# Patient Record
Sex: Female | Born: 1960 | Race: White | Hispanic: No | Marital: Married | Smoking: Never smoker
Health system: Southern US, Community
[De-identification: ages and names within clinical notes are randomized; demographics above are authoritative.]

## PROBLEM LIST (undated history)

## (undated) DIAGNOSIS — N200 Calculus of kidney: Secondary | ICD-10-CM

## (undated) HISTORY — DX: Calculus of kidney: N20.0

---

## 2013-12-29 ENCOUNTER — Ambulatory Visit: Payer: BLUE CROSS/BLUE SHIELD

## 2013-12-29 ENCOUNTER — Encounter (HOSPITAL_COMMUNITY): Payer: BC Managed Care – PPO | Admitting: Anesthesiology

## 2013-12-29 ENCOUNTER — Ambulatory Visit: Admit: 2013-12-29 | Payer: Self-pay | Admitting: General Surgery

## 2013-12-29 ENCOUNTER — Ambulatory Visit (HOSPITAL_COMMUNITY)
Admission: RE | Admit: 2013-12-29 | Discharge: 2013-12-29 | Disposition: A | Discharge status: Home / Self Care | Payer: BC Managed Care – PPO | Source: Ambulatory Visit | Attending: Family Medicine | Admitting: Family Medicine

## 2013-12-29 ENCOUNTER — Emergency Department (HOSPITAL_COMMUNITY): Payer: BC Managed Care – PPO | Admitting: Anesthesiology

## 2013-12-29 ENCOUNTER — Encounter (HOSPITAL_COMMUNITY)
Admission: EM | Disposition: A | Discharge status: Home / Self Care | Payer: Self-pay | Source: Home / Self Care | Attending: Emergency Medicine

## 2013-12-29 ENCOUNTER — Observation Stay (HOSPITAL_COMMUNITY)
Admission: EM | Admit: 2013-12-29 | Discharge: 2013-12-30 | Disposition: A | Discharge status: Home / Self Care | Payer: BC Managed Care – PPO | Attending: Surgery | Admitting: Surgery

## 2013-12-29 ENCOUNTER — Encounter (HOSPITAL_COMMUNITY): Payer: Self-pay | Admitting: Emergency Medicine

## 2013-12-29 ENCOUNTER — Ambulatory Visit (INDEPENDENT_AMBULATORY_CARE_PROVIDER_SITE_OTHER): Payer: BLUE CROSS/BLUE SHIELD | Admitting: Family Medicine

## 2013-12-29 VITALS — BP 160/90 | HR 98 | Temp 97.9°F | Resp 18 | Ht 65.0 in | Wt 191.2 lb

## 2013-12-29 DIAGNOSIS — R110 Nausea: Secondary | ICD-10-CM

## 2013-12-29 DIAGNOSIS — K3580 Unspecified acute appendicitis: Secondary | ICD-10-CM

## 2013-12-29 DIAGNOSIS — K37 Unspecified appendicitis: Secondary | ICD-10-CM

## 2013-12-29 DIAGNOSIS — R1033 Periumbilical pain: Secondary | ICD-10-CM | POA: Insufficient documentation

## 2013-12-29 DIAGNOSIS — R1031 Right lower quadrant pain: Secondary | ICD-10-CM | POA: Insufficient documentation

## 2013-12-29 DIAGNOSIS — R1011 Right upper quadrant pain: Secondary | ICD-10-CM

## 2013-12-29 DIAGNOSIS — R109 Unspecified abdominal pain: Secondary | ICD-10-CM

## 2013-12-29 HISTORY — PX: LAPAROSCOPIC APPENDECTOMY: SHX408

## 2013-12-29 LAB — POCT CBC
GRANULOCYTE PERCENT: 81.4 % — AB (ref 37–80)
HCT, POC: 44.3 % (ref 37.7–47.9)
Hemoglobin: 14.2 g/dL (ref 12.2–16.2)
Lymph, poc: 1.3 (ref 0.6–3.4)
MCH, POC: 30.8 pg (ref 27–31.2)
MCHC: 32.1 g/dL (ref 31.8–35.4)
MCV: 96.1 fL (ref 80–97)
MID (CBC): 0.8 (ref 0–0.9)
MPV: 11.7 fL (ref 0–99.8)
PLATELET COUNT, POC: 189 10*3/uL (ref 142–424)
POC GRANULOCYTE: 9 — AB (ref 2–6.9)
POC LYMPH PERCENT: 11.7 %L (ref 10–50)
POC MID %: 6.9 % (ref 0–12)
RBC: 4.61 M/uL (ref 4.04–5.48)
RDW, POC: 12.5 %
WBC: 11.1 10*3/uL — AB (ref 4.6–10.2)

## 2013-12-29 LAB — POCT URINALYSIS DIPSTICK
Bilirubin, UA: NEGATIVE
GLUCOSE UA: NEGATIVE
Ketones, UA: NEGATIVE
Leukocytes, UA: NEGATIVE
Nitrite, UA: NEGATIVE
RBC UA: NEGATIVE
Spec Grav, UA: 1.02
UROBILINOGEN UA: 0.2
pH, UA: 6

## 2013-12-29 LAB — BASIC METABOLIC PANEL
BUN: 13 mg/dL (ref 6–23)
CALCIUM: 9.7 mg/dL (ref 8.4–10.5)
CHLORIDE: 96 meq/L (ref 96–112)
CO2: 28 mEq/L (ref 19–32)
Creatinine, Ser: 0.72 mg/dL (ref 0.50–1.10)
GFR calc non Af Amer: >90 mL/min (ref >90)
Glucose, Bld: 116 mg/dL — ABNORMAL HIGH (ref 70–99)
Potassium: 3.7 mEq/L (ref 3.7–5.3)
Sodium: 136 mEq/L — ABNORMAL LOW (ref 137–147)

## 2013-12-29 LAB — POCT UA - MICROSCOPIC ONLY
CRYSTALS, UR, HPF, POC: NEGATIVE
Casts, Ur, LPF, POC: NEGATIVE
Mucus, UA: POSITIVE
RBC, urine, microscopic: NEGATIVE
Yeast, UA: NEGATIVE

## 2013-12-29 SURGERY — APPENDECTOMY, LAPAROSCOPIC
Anesthesia: General | Site: Abdomen

## 2013-12-29 MED ORDER — HEPARIN SODIUM (PORCINE) 5000 UNIT/ML IJ SOLN
5000.0000 [IU] | Freq: Three times a day (TID) | INTRAMUSCULAR | Status: DC
  Filled 2013-12-29 (×3): qty 1

## 2013-12-29 MED ORDER — ONDANSETRON HCL 4 MG/2ML IJ SOLN: 4.0000 mg | INTRAMUSCULAR | Status: DC | PRN

## 2013-12-29 MED ORDER — ONDANSETRON HCL 4 MG/2ML IJ SOLN
INTRAMUSCULAR | Status: AC
  Filled 2013-12-29: qty 2

## 2013-12-29 MED ORDER — ROCURONIUM BROMIDE 100 MG/10ML IV SOLN
INTRAVENOUS | Status: AC
  Filled 2013-12-29: qty 1

## 2013-12-29 MED ORDER — MIDAZOLAM HCL 2 MG/2ML IJ SOLN
INTRAMUSCULAR | Status: AC
  Filled 2013-12-29: qty 2

## 2013-12-29 MED ORDER — IOHEXOL 300 MG/ML  SOLN
50.0000 mL | Freq: Once | INTRAMUSCULAR | Status: AC | PRN
  Administered 2013-12-29: 50 mL via ORAL

## 2013-12-29 MED ORDER — MIDAZOLAM HCL 5 MG/5ML IJ SOLN
INTRAMUSCULAR | Status: DC | PRN
  Administered 2013-12-29: 2 mg via INTRAVENOUS

## 2013-12-29 MED ORDER — BUPIVACAINE HCL (PF) 0.5 % IJ SOLN
INTRAMUSCULAR | Status: DC | PRN
  Administered 2013-12-29: 8 mL

## 2013-12-29 MED ORDER — ROCURONIUM BROMIDE 100 MG/10ML IV SOLN
INTRAVENOUS | Status: DC | PRN
  Administered 2013-12-29: 10 mg via INTRAVENOUS
  Administered 2013-12-29: 30 mg via INTRAVENOUS

## 2013-12-29 MED ORDER — ONDANSETRON HCL 4 MG PO TABS: 4.0000 mg | ORAL_TABLET | Freq: Four times a day (QID) | ORAL | Status: DC | PRN

## 2013-12-29 MED ORDER — DEXAMETHASONE SODIUM PHOSPHATE 10 MG/ML IJ SOLN
INTRAMUSCULAR | Status: AC
  Filled 2013-12-29: qty 1

## 2013-12-29 MED ORDER — SUCCINYLCHOLINE CHLORIDE 20 MG/ML IJ SOLN
INTRAMUSCULAR | Status: DC | PRN
Start: 2013-12-29 — End: 2013-12-29
  Administered 2013-12-29: 100 mg via INTRAVENOUS

## 2013-12-29 MED ORDER — PIPERACILLIN-TAZOBACTAM 3.375 G IVPB
3.3750 g | Freq: Once | INTRAVENOUS | Status: AC
  Administered 2013-12-29: 3.375 g via INTRAVENOUS
  Filled 2013-12-29 (×2): qty 50

## 2013-12-29 MED ORDER — NEOSTIGMINE METHYLSULFATE 10 MG/10ML IV SOLN
INTRAVENOUS | Status: AC
  Filled 2013-12-29: qty 1

## 2013-12-29 MED ORDER — IOHEXOL 300 MG/ML  SOLN
100.0000 mL | Freq: Once | INTRAMUSCULAR | Status: AC | PRN
  Administered 2013-12-29: 100 mL via INTRAVENOUS

## 2013-12-29 MED ORDER — GLYCOPYRROLATE 0.2 MG/ML IJ SOLN
INTRAMUSCULAR | Status: AC
  Filled 2013-12-29: qty 3

## 2013-12-29 MED ORDER — KCL-LACTATED RINGERS-D5W 20 MEQ/L IV SOLN
INTRAVENOUS | Status: DC
  Administered 2013-12-29: via INTRAVENOUS
  Filled 2013-12-29 (×2): qty 1000

## 2013-12-29 MED ORDER — GLYCOPYRROLATE 0.2 MG/ML IJ SOLN
INTRAMUSCULAR | Status: DC | PRN
  Administered 2013-12-29: 0.6 mg via INTRAVENOUS

## 2013-12-29 MED ORDER — PROPOFOL 10 MG/ML IV BOLUS
INTRAVENOUS | Status: DC | PRN
  Administered 2013-12-29: 200 mg via INTRAVENOUS

## 2013-12-29 MED ORDER — PROPOFOL 10 MG/ML IV BOLUS
INTRAVENOUS | Status: AC
  Filled 2013-12-29: qty 20

## 2013-12-29 MED ORDER — LACTATED RINGERS IV SOLN
INTRAVENOUS | Status: DC | PRN
  Administered 2013-12-29: 20:00:00 via INTRAVENOUS

## 2013-12-29 MED ORDER — HYDROMORPHONE HCL PF 1 MG/ML IJ SOLN
1.0000 mg | Freq: Once | INTRAMUSCULAR | Status: AC
  Administered 2013-12-29: 1 mg via INTRAVENOUS
  Filled 2013-12-29: qty 1

## 2013-12-29 MED ORDER — OXYCODONE-ACETAMINOPHEN 5-325 MG PO TABS
1.0000 | ORAL_TABLET | ORAL | Status: DC | PRN
  Administered 2013-12-30: 2 via ORAL
  Filled 2013-12-29: qty 2

## 2013-12-29 MED ORDER — FENTANYL CITRATE 0.05 MG/ML IJ SOLN
INTRAMUSCULAR | Status: AC
  Filled 2013-12-29: qty 5

## 2013-12-29 MED ORDER — ONDANSETRON HCL 4 MG/2ML IJ SOLN
INTRAMUSCULAR | Status: DC | PRN
  Administered 2013-12-29: 4 mg via INTRAVENOUS

## 2013-12-29 MED ORDER — ONDANSETRON HCL 4 MG/2ML IJ SOLN
4.0000 mg | Freq: Once | INTRAMUSCULAR | Status: AC
  Administered 2013-12-29: 4 mg via INTRAVENOUS
  Filled 2013-12-29: qty 2

## 2013-12-29 MED ORDER — PIPERACILLIN-TAZOBACTAM 3.375 G IVPB
3.3750 g | Freq: Three times a day (TID) | INTRAVENOUS | Status: DC
  Administered 2013-12-29 – 2013-12-30 (×2): 3.375 g via INTRAVENOUS
  Filled 2013-12-29 (×3): qty 50

## 2013-12-29 MED ORDER — LACTATED RINGERS IV SOLN
INTRAVENOUS | Status: DC | PRN
  Administered 2013-12-29: 2000 mL via INTRAVENOUS

## 2013-12-29 MED ORDER — SODIUM CHLORIDE 0.9 % IV SOLN
3.0000 g | Freq: Once | INTRAVENOUS | Status: DC
  Filled 2013-12-29: qty 3

## 2013-12-29 MED ORDER — BUPIVACAINE HCL (PF) 0.5 % IJ SOLN
INTRAMUSCULAR | Status: AC
  Filled 2013-12-29: qty 30

## 2013-12-29 MED ORDER — DEXAMETHASONE SODIUM PHOSPHATE 10 MG/ML IJ SOLN
INTRAMUSCULAR | Status: DC | PRN
  Administered 2013-12-29: 10 mg via INTRAVENOUS

## 2013-12-29 MED ORDER — MORPHINE SULFATE 2 MG/ML IJ SOLN
2.0000 mg | INTRAMUSCULAR | Status: DC | PRN
  Administered 2013-12-29 – 2013-12-30 (×2): 4 mg via INTRAVENOUS
  Filled 2013-12-29 (×2): qty 2

## 2013-12-29 MED ORDER — FENTANYL CITRATE 0.05 MG/ML IJ SOLN
INTRAMUSCULAR | Status: DC | PRN
  Administered 2013-12-29: 50 ug via INTRAVENOUS
  Administered 2013-12-29: 100 ug via INTRAVENOUS
  Administered 2013-12-29 (×2): 50 ug via INTRAVENOUS

## 2013-12-29 MED ORDER — SODIUM CHLORIDE 0.9 % IV BOLUS (SEPSIS)
1000.0000 mL | Freq: Once | INTRAVENOUS | Status: AC
  Administered 2013-12-29: 1000 mL via INTRAVENOUS

## 2013-12-29 MED ORDER — LIDOCAINE HCL (PF) 2 % IJ SOLN
INTRAMUSCULAR | Status: DC | PRN
  Administered 2013-12-29: 75 mg via INTRADERMAL

## 2013-12-29 MED ORDER — NEOSTIGMINE METHYLSULFATE 10 MG/10ML IV SOLN
INTRAVENOUS | Status: DC | PRN
  Administered 2013-12-29: 5 mg via INTRAVENOUS

## 2013-12-29 MED ORDER — LIDOCAINE HCL (CARDIAC) 20 MG/ML IV SOLN
INTRAVENOUS | Status: AC
  Filled 2013-12-29: qty 5

## 2013-12-29 SURGICAL SUPPLY — 47 items
APL SKNCLS STERI-STRIP NONHPOA (GAUZE/BANDAGES/DRESSINGS) ×1
APPLIER CLIP 5 13 M/L LIGAMAX5 (MISCELLANEOUS)
APPLIER CLIP ROT 10 11.4 M/L (STAPLE)
APR CLP MED LRG 11.4X10 (STAPLE)
APR CLP MED LRG 5 ANG JAW (MISCELLANEOUS)
BAG SPEC RTRVL LRG 6X4 10 (ENDOMECHANICALS) ×1
BENZOIN TINCTURE PRP APPL 2/3 (GAUZE/BANDAGES/DRESSINGS) ×2 IMPLANT
CANISTER SUCTION 2500CC (MISCELLANEOUS) ×2 IMPLANT
CHLORAPREP W/TINT 26ML (MISCELLANEOUS) ×2 IMPLANT
CLIP APPLIE 5 13 M/L LIGAMAX5 (MISCELLANEOUS) IMPLANT
CLIP APPLIE ROT 10 11.4 M/L (STAPLE) IMPLANT
CUTTER FLEX LINEAR 45M (STAPLE) ×1 IMPLANT
DECANTER SPIKE VIAL GLASS SM (MISCELLANEOUS) ×2 IMPLANT
DRAIN CHANNEL 19F RND (DRAIN) IMPLANT
DRAPE LAPAROSCOPIC ABDOMINAL (DRAPES) ×2 IMPLANT
DRSG TEGADERM 2-3/8X2-3/4 SM (GAUZE/BANDAGES/DRESSINGS) ×3 IMPLANT
ELECT REM PT RETURN 9FT ADLT (ELECTROSURGICAL) ×2
ELECTRODE REM PT RTRN 9FT ADLT (ELECTROSURGICAL) ×1 IMPLANT
ENDOLOOP SUT PDS II  0 18 (SUTURE)
ENDOLOOP SUT PDS II 0 18 (SUTURE) IMPLANT
EVACUATOR SILICONE 100CC (DRAIN) IMPLANT
GAUZE SPONGE 2X2 8PLY STRL LF (GAUZE/BANDAGES/DRESSINGS) IMPLANT
GLOVE ECLIPSE 8.0 STRL XLNG CF (GLOVE) ×2 IMPLANT
GLOVE INDICATOR 8.0 STRL GRN (GLOVE) ×2 IMPLANT
GOWN STRL REUS W/TWL XL LVL3 (GOWN DISPOSABLE) ×4 IMPLANT
KIT BASIN OR (CUSTOM PROCEDURE TRAY) ×2 IMPLANT
POUCH SPECIMEN RETRIEVAL 10MM (ENDOMECHANICALS) ×2 IMPLANT
RELOAD 45 VASCULAR/THIN (ENDOMECHANICALS) IMPLANT
RELOAD STAPLE 45 2.5 WHT GRN (ENDOMECHANICALS) IMPLANT
RELOAD STAPLE 45 3.5 BLU ETS (ENDOMECHANICALS) IMPLANT
RELOAD STAPLE TA45 3.5 REG BLU (ENDOMECHANICALS) ×2 IMPLANT
SET IRRIG TUBING LAPAROSCOPIC (IRRIGATION / IRRIGATOR) ×2 IMPLANT
SHEARS HARMONIC ACE PLUS 36CM (ENDOMECHANICALS) ×2 IMPLANT
SLEEVE XCEL OPT CAN 5 100 (ENDOMECHANICALS) ×2 IMPLANT
SOLUTION ANTI FOG 6CC (MISCELLANEOUS) ×2 IMPLANT
SPONGE GAUZE 2X2 STER 10/PKG (GAUZE/BANDAGES/DRESSINGS) ×1
STRIP CLOSURE SKIN 1/2X4 (GAUZE/BANDAGES/DRESSINGS) ×2 IMPLANT
SUT ETHILON 3 0 PS 1 (SUTURE) IMPLANT
SUT MNCRL AB 4-0 PS2 18 (SUTURE) ×2 IMPLANT
TAPE STRIPS DRAPE STRL (GAUZE/BANDAGES/DRESSINGS) ×1 IMPLANT
TOWEL OR 17X26 10 PK STRL BLUE (TOWEL DISPOSABLE) ×2 IMPLANT
TOWEL OR NON WOVEN STRL DISP B (DISPOSABLE) ×2 IMPLANT
TRAY FOLEY CATH 14FRSI W/METER (CATHETERS) ×2 IMPLANT
TRAY LAP CHOLE (CUSTOM PROCEDURE TRAY) ×2 IMPLANT
TROCAR BLADELESS OPT 5 100 (ENDOMECHANICALS) ×2 IMPLANT
TROCAR XCEL BLUNT TIP 100MML (ENDOMECHANICALS) ×2 IMPLANT
TUBING INSUFFLATION 10FT LAP (TUBING) ×2 IMPLANT

## 2013-12-29 NOTE — Transfer of Care (Signed)
Immediate Anesthesia Transfer of Care Note  Patient: Dawn Humphrey  Procedure(s) Performed: Procedure(s): APPENDECTOMY LAPAROSCOPIC (N/A)  Patient Location: PACU  Anesthesia Type:General  Level of Consciousness: awake, alert , oriented and patient cooperative  Airway & Oxygen Therapy: Patient Spontanous Breathing and Patient connected to face mask oxygen  Post-op Assessment: Report given to PACU RN, Post -op Vital signs reviewed and stable and Patient moving all extremities X 4  Post vital signs: Reviewed and stable  Complications: No apparent anesthesia complications

## 2013-12-29 NOTE — H&P (Signed)
Dawn Humphrey is an 53 y.o. female.   Chief Complaint: Abdominal pain  HPI: She had the onset of centralized abdominal pain yesterday at 3 PM. It then radiated down to the right lower quadrant. She had some fever and chills as well as nausea. She presented to Dr. Clayborn Heron office and was evaluated. She was sent for a CT scan which was consistent with acute appendicitis. She subsequently was told to come to [facility] emergency department and I was asked to see her.  History reviewed. No pertinent past medical history.  Past Surgical History  Procedure Laterality Date  . Cesarean section      Family History  Problem Relation Age of Onset  . Cancer Father    Social History:  reports that she has never smoked. She has never used smokeless tobacco. She reports that she drinks about 1.2 ounces of alcohol per week. Her drug history is not on file.  Allergies: No Known Allergies  Prior to Admission medications   Not on File      (Not in a hospital admission)  Results for orders placed in visit on 12/29/13 (from the past 48 hour(s))  POCT CBC     Status: Abnormal   Collection Time    12/29/13 11:31 AM      Result Value Ref Range   WBC 11.1 (*) 4.6 - 10.2 K/uL   Lymph, poc 1.3  0.6 - 3.4   POC LYMPH PERCENT 11.7  10 - 50 %L   MID (cbc) 0.8  0 - 0.9   POC MID % 6.9  0 - 12 %M   POC Granulocyte 9.0 (*) 2 - 6.9   Granulocyte percent 81.4 (*) 37 - 80 %G   RBC 4.61  4.04 - 5.48 M/uL   Hemoglobin 14.2  12.2 - 16.2 g/dL   HCT, POC 44.3  37.7 - 47.9 %   MCV 96.1  80 - 97 fL   MCH, POC 30.8  27 - 31.2 pg   MCHC 32.1  31.8 - 35.4 g/dL   RDW, POC 12.5     Platelet Count, POC 189  142 - 424 K/uL   MPV 11.7  0 - 99.8 fL  POCT URINALYSIS DIPSTICK     Status: Normal   Collection Time    12/29/13 11:31 AM      Result Value Ref Range   Color, UA yellow     Clarity, UA clear     Glucose, UA neg     Bilirubin, UA neg     Ketones, UA neg     Spec Grav, UA 1.020     Blood, UA neg     pH, UA 6.0      Protein, UA trace     Urobilinogen, UA 0.2     Nitrite, UA neg     Leukocytes, UA Negative    POCT UA - MICROSCOPIC ONLY     Status: Abnormal   Collection Time    12/29/13 11:31 AM      Result Value Ref Range   WBC, Ur, HPF, POC 1-5     RBC, urine, microscopic neg     Bacteria, U Microscopic 1+     Mucus, UA positive     Epithelial cells, urine per micros 6-8     Crystals, Ur, HPF, POC neg     Casts, Ur, LPF, POC neg     Yeast, UA neg     Ct Abdomen Pelvis W Contrast  12/29/2013  CLINICAL DATA:  Right lower quadrant pain  EXAM: CT ABDOMEN AND PELVIS WITH CONTRAST  TECHNIQUE: Multidetector CT imaging of the abdomen and pelvis was performed using the standard protocol following bolus administration of intravenous contrast.  CONTRAST:  70mL OMNIPAQUE IOHEXOL 300 MG/ML SOLN, 135mL OMNIPAQUE IOHEXOL 300 MG/ML SOLN  COMPARISON:  None.  FINDINGS: The lung bases are clear.  The liver demonstrates no focal abnormality. There is no intrahepatic or extrahepatic biliary ductal dilatation. The gallbladder is normal. The spleen demonstrates no focal abnormality. The kidneys, adrenal glands and pancreas are normal. The bladder is unremarkable.  The stomach, duodenum, small intestine, and large intestine demonstrate no contrast extravasation or dilatation. The appendix is dilated measuring 13 mm in diameter with periappendiceal inflammatory changes most consistent with acute appendicitis. There is bowel wall thickening of the terminal ileum which is likely reactive secondary to adjacent periappendiceal inflammatory changes. There is no pneumoperitoneum, pneumatosis, or portal venous gas. There is a small amount of pelvic free fluid. No focal fluid collection to suggest an abscess. There is no lymphadenopathy.  The abdominal aorta is normal in caliber.  There are no lytic or sclerotic osseous lesions.  IMPRESSION: 1. Findings most consistent with acute appendicitis. No focal fluid collection to suggest an  abscess. These results were called by telephone at the time of interpretation on 12/29/2013 at 3:45 PM to Dr. Ruben Reason , who verbally acknowledged these results.   Electronically Signed   By: Kathreen Devoid   On: 12/29/2013 16:02   Dg Abd Acute W/chest  12/29/2013   CLINICAL DATA:  Right upper quadrant pain  EXAM: ACUTE ABDOMEN SERIES (ABDOMEN 2 VIEW & CHEST 1 VIEW)  COMPARISON:  None.  FINDINGS: There is no evidence of dilated bowel loops or free intraperitoneal air. No radiopaque calculi or other significant radiographic abnormality is seen. Heart size and mediastinal contours are within normal limits. Both lungs are clear.  IMPRESSION: Negative abdominal radiographs.  No acute cardiopulmonary disease.   Electronically Signed   By: Kathreen Devoid   On: 12/29/2013 12:49    Review of Systems  Constitutional: Positive for fever and chills.  HENT: Negative.   Cardiovascular: Negative.   Gastrointestinal: Positive for nausea and abdominal pain. Negative for diarrhea and blood in stool.  Genitourinary: Positive for dysuria. Negative for hematuria.  Endo/Heme/Allergies: Does not bruise/bleed easily.    Blood pressure 125/93, pulse 108, temperature 99.7 F (37.6 C), temperature source Oral, resp. rate 19, SpO2 98.00%. Physical Exam  Constitutional: She appears well-developed and well-nourished. No distress.  HENT:  Head: Normocephalic and atraumatic.  Eyes: EOM are normal. No scleral icterus.  Neck: Neck supple.  Cardiovascular: Normal rate and regular rhythm.   Respiratory: Effort normal and breath sounds normal.  GI: Soft. She exhibits no mass. There is tenderness (RLQ). There is guarding (RLQ).  Lower transverse scar  Musculoskeletal: She exhibits no edema.  Lymphadenopathy:    She has no cervical adenopathy.  Neurological: She is alert.  Skin: Skin is warm and dry.  Psychiatric: She has a normal mood and affect. Her behavior is normal.     Assessment/Plan Acute appendicitis.  Plan:  IV antibiotics. Laparoscopic possible open appendectomy. I have discussed the procedure and risks of appendectomy. The risks include but are not limited to bleeding, infection, wound problems, anesthesia, injury to intra-abdominal organs, possibility of postoperative ileus. She seems to understand and agrees with the plan.  Rhunette Croft Livan Hires 12/29/2013, 6:33 PM

## 2013-12-29 NOTE — Patient Instructions (Signed)
Go to [facility] for outpatient CT abd/pelvis. Register at radiology 1st floor.

## 2013-12-29 NOTE — Progress Notes (Signed)
Subjective: 53 year old lady who is here abdominal pain started about 3 PM yesterday. It is persisted in hurting. The pain started a little bit gently, then moved mostly to the right lower quadrant. It has persisted. She has been nauseous but no vomiting. She had a small bowel movement this morning. She has not had pain like this in the past. She has not been febrile though she has hot flashes.  Objective: Pleasant alert lady. Chest clear. Heart regular without murmurs. Skin normal to touch. Does not feel febrile. Abdomen has bowel sounds present. Soft without organomegaly or masses. Diffuse tenderness but most marked tenderness in the right lower quadrant. Some rebound to right lower quadrant.  Assessment: Right lower quadrant abdominal pain Rule out appendicitis  Plan: Abdominal x-ray, CBC, UA  Results for orders placed in visit on 12/29/13  POCT CBC      Result Value Ref Range   WBC 11.1 (*) 4.6 - 10.2 K/uL   Lymph, poc 1.3  0.6 - 3.4   POC LYMPH PERCENT 11.7  10 - 50 %L   MID (cbc) 0.8  0 - 0.9   POC MID % 6.9  0 - 12 %M   POC Granulocyte 9.0 (*) 2 - 6.9   Granulocyte percent 81.4 (*) 37 - 80 %G   RBC 4.61  4.04 - 5.48 M/uL   Hemoglobin 14.2  12.2 - 16.2 g/dL   HCT, POC 44.3  37.7 - 47.9 %   MCV 96.1  80 - 97 fL   MCH, POC 30.8  27 - 31.2 pg   MCHC 32.1  31.8 - 35.4 g/dL   RDW, POC 12.5     Platelet Count, POC 189  142 - 424 K/uL   MPV 11.7  0 - 99.8 fL  POCT URINALYSIS DIPSTICK      Result Value Ref Range   Color, UA yellow     Clarity, UA clear     Glucose, UA neg     Bilirubin, UA neg     Ketones, UA neg     Spec Grav, UA 1.020     Blood, UA neg     pH, UA 6.0     Protein, UA trace     Urobilinogen, UA 0.2     Nitrite, UA neg     Leukocytes, UA Negative    POCT UA - MICROSCOPIC ONLY      Result Value Ref Range   WBC, Ur, HPF, POC 1-5     RBC, urine, microscopic neg     Bacteria, U Microscopic 1+     Mucus, UA positive     Epithelial cells, urine per micros  6-8     Crystals, Ur, HPF, POC neg     Casts, Ur, LPF, POC neg     Yeast, UA neg     [facility] reading (PRIMARY) by  Dr. Linna Darner Normal abdomen  Assessment: Vital quadrant abdominal pain rule out appendicitis  Plan: Stat CT scan at [facility] long. Call report.  CT shows acute appendicitis. Patient sent to the emergency room. Emergency room notified the patient would need to see a surgeon. Patient instructed on the phone what would need to take place.  50 minutes spent in patient care.  Assessment: Acute appendicitis

## 2013-12-29 NOTE — ED Provider Notes (Signed)
CSN: [id number]     Arrival date & time 12/29/13  1709 History   First MD Initiated Contact with Patient 12/29/13 1719     Chief Complaint  Patient presents with  . RLQ pain      (Consider location/radiation/quality/duration/timing/severity/associated sxs/prior Treatment) The history is provided by the patient.  pt c/o rlq abdominal pain since yesterday. Acute onset, constant, slowly worse. Pain dull, moderate-severe, non radiating, worse w palpation.  Decreased appetite. Nausea, no vomiting. No diarrhea. No hx same pain. No fever or chills. Only prior abd surgery is remote hx c section. No dysuria or hematuria. No vaginal discharge or bleeding, states is post-menopausal.      History reviewed. No pertinent past medical history. Past Surgical History  Procedure Laterality Date  . Cesarean section     Family History  Problem Relation Age of Onset  . Cancer Father    History  Substance Use Topics  . Smoking status: Never Smoker   . Smokeless tobacco: Never Used  . Alcohol Use: 1.2 oz/week    2 Glasses of wine per week   OB History   Grav Para Term Preterm Abortions TAB SAB Ect Mult Living                 Review of Systems  Constitutional: Negative for fever.  HENT: Negative for sore throat.   Eyes: Negative for redness.  Respiratory: Negative for shortness of breath.   Cardiovascular: Negative for chest pain.  Gastrointestinal: Positive for abdominal pain. Negative for vomiting and diarrhea.  Genitourinary: Negative for dysuria and flank pain.  Musculoskeletal: Negative for back pain and neck pain.  Skin: Negative for rash.  Neurological: Negative for headaches.  Hematological: Does not bruise/bleed easily.  Psychiatric/Behavioral: Negative for confusion.      Allergies  Review of patient's allergies indicates no known allergies.  Home Medications   Prior to Admission medications   Not on File   BP 125/93  Pulse 108  Temp(Src) 99.7 F (37.6 C) (Oral)   Resp 19  SpO2 98% Physical Exam  Nursing note and vitals reviewed. Constitutional: She appears well-developed and well-nourished. No distress.  HENT:  Mouth/Throat: Oropharynx is clear and moist.  Eyes: Conjunctivae are normal. No scleral icterus.  Neck: Neck supple. No tracheal deviation present.  Cardiovascular: Normal rate.   Pulmonary/Chest: Effort normal. No respiratory distress.  Abdominal: Soft. Normal appearance. She exhibits no distension and no mass. There is tenderness. There is guarding. There is no rebound.  Marked rlq tenderness.   Genitourinary:  No cva tenderness  Musculoskeletal: She exhibits no edema.  Neurological: She is alert.  Skin: Skin is warm and dry. No rash noted. She is not diaphoretic.  Psychiatric: She has a normal mood and affect.    ED Course  Procedures (including critical care time) Labs Review   Results for orders placed in visit on 12/29/13  POCT CBC      Result Value Ref Range   WBC 11.1 (*) 4.6 - 10.2 K/uL   Lymph, poc 1.3  0.6 - 3.4   POC LYMPH PERCENT 11.7  10 - 50 %L   MID (cbc) 0.8  0 - 0.9   POC MID % 6.9  0 - 12 %M   POC Granulocyte 9.0 (*) 2 - 6.9   Granulocyte percent 81.4 (*) 37 - 80 %G   RBC 4.61  4.04 - 5.48 M/uL   Hemoglobin 14.2  12.2 - 16.2 g/dL   HCT, POC 44.3  37.7 -  47.9 %   MCV 96.1  80 - 97 fL   MCH, POC 30.8  27 - 31.2 pg   MCHC 32.1  31.8 - 35.4 g/dL   RDW, POC 12.5     Platelet Count, POC 189  142 - 424 K/uL   MPV 11.7  0 - 99.8 fL  POCT URINALYSIS DIPSTICK      Result Value Ref Range   Color, UA yellow     Clarity, UA clear     Glucose, UA neg     Bilirubin, UA neg     Ketones, UA neg     Spec Grav, UA 1.020     Blood, UA neg     pH, UA 6.0     Protein, UA trace     Urobilinogen, UA 0.2     Nitrite, UA neg     Leukocytes, UA Negative    POCT UA - MICROSCOPIC ONLY      Result Value Ref Range   WBC, Ur, HPF, POC 1-5     RBC, urine, microscopic neg     Bacteria, U Microscopic 1+     Mucus, UA  positive     Epithelial cells, urine per micros 6-8     Crystals, Ur, HPF, POC neg     Casts, Ur, LPF, POC neg     Yeast, UA neg     Ct Abdomen Pelvis W Contrast  12/29/2013   CLINICAL DATA:  Right lower quadrant pain  EXAM: CT ABDOMEN AND PELVIS WITH CONTRAST  TECHNIQUE: Multidetector CT imaging of the abdomen and pelvis was performed using the standard protocol following bolus administration of intravenous contrast.  CONTRAST:  66mL OMNIPAQUE IOHEXOL 300 MG/ML SOLN, 139mL OMNIPAQUE IOHEXOL 300 MG/ML SOLN  COMPARISON:  None.  FINDINGS: The lung bases are clear.  The liver demonstrates no focal abnormality. There is no intrahepatic or extrahepatic biliary ductal dilatation. The gallbladder is normal. The spleen demonstrates no focal abnormality. The kidneys, adrenal glands and pancreas are normal. The bladder is unremarkable.  The stomach, duodenum, small intestine, and large intestine demonstrate no contrast extravasation or dilatation. The appendix is dilated measuring 13 mm in diameter with periappendiceal inflammatory changes most consistent with acute appendicitis. There is bowel wall thickening of the terminal ileum which is likely reactive secondary to adjacent periappendiceal inflammatory changes. There is no pneumoperitoneum, pneumatosis, or portal venous gas. There is a small amount of pelvic free fluid. No focal fluid collection to suggest an abscess. There is no lymphadenopathy.  The abdominal aorta is normal in caliber.  There are no lytic or sclerotic osseous lesions.  IMPRESSION: 1. Findings most consistent with acute appendicitis. No focal fluid collection to suggest an abscess. These results were called by telephone at the time of interpretation on 12/29/2013 at 3:45 PM to Dr. Ruben Reason , who verbally acknowledged these results.   Electronically Signed   By: Kathreen Devoid   On: 12/29/2013 16:02   Dg Abd Acute W/chest  12/29/2013   CLINICAL DATA:  Right upper quadrant pain  EXAM: ACUTE  ABDOMEN SERIES (ABDOMEN 2 VIEW & CHEST 1 VIEW)  COMPARISON:  None.  FINDINGS: There is no evidence of dilated bowel loops or free intraperitoneal air. No radiopaque calculi or other significant radiographic abnormality is seen. Heart size and mediastinal contours are within normal limits. Both lungs are clear.  IMPRESSION: Negative abdominal radiographs.  No acute cardiopulmonary disease.   Electronically Signed   By: Kathreen Devoid  On: 12/29/2013 12:49      MDM  Iv ns bolus. Dilaudid 1 mg iv. zofran iv.   Reviewed ct from earlier today c/w appendicitis - gen surgery called.  Pt kept npo.  Iv abx ordered.   Reviewed nursing notes and prior charts for additional history.   Pt to OR w general surgery re acute appendicitis.       Mirna Mires, MD 12/29/13 [phone]

## 2013-12-29 NOTE — Anesthesia Preprocedure Evaluation (Signed)
Anesthesia Evaluation  Patient identified by MRN, date of birth, ID band Patient awake    Reviewed: Allergy & Precautions, H&P , NPO status , Patient's Chart, lab work & pertinent test results  Airway Mallampati: II TM Distance: >3 FB Neck ROM: Full    Dental no notable dental hx.    Pulmonary neg pulmonary ROS,  breath sounds clear to auscultation  Pulmonary exam normal       Cardiovascular negative cardio ROS  Rhythm:Regular Rate:Normal     Neuro/Psych negative neurological ROS  negative psych ROS   GI/Hepatic negative GI ROS, Neg liver ROS,   Endo/Other  negative endocrine ROS  Renal/GU negative Renal ROS  negative genitourinary   Musculoskeletal negative musculoskeletal ROS (+)   Abdominal   Peds negative pediatric ROS (+)  Hematology negative hematology ROS (+)   Anesthesia Other Findings   Reproductive/Obstetrics negative OB ROS                           Anesthesia Physical Anesthesia Plan  ASA: I and emergent  Anesthesia Plan: General   Post-op Pain Management:    Induction: Intravenous and Rapid sequence  Airway Management Planned: Oral ETT  Additional Equipment:   Intra-op Plan:   Post-operative Plan: Extubation in OR  Informed Consent: I have reviewed the patients History and Physical, chart, labs and discussed the procedure including the risks, benefits and alternatives for the proposed anesthesia with the patient or authorized representative who has indicated his/her understanding and acceptance.   Dental advisory given  Plan Discussed with: CRNA and Surgeon  Anesthesia Plan Comments:         Anesthesia Quick Evaluation

## 2013-12-29 NOTE — ED Notes (Signed)
Pt c/o RLQ pain that started around 330pm yesterday. Pt had CT scan today and was told to check in to see surgery for possible appendicitis.  Pt states she has nausea but denies v/d.

## 2013-12-29 NOTE — Op Note (Signed)
Dawn Humphrey, 1960/09/22  Pre-operative Diagnosis: Acute appendicitis without mention of peritonitis  Post-operative Diagnosis: Same  Procedure:  Laparoscopic appendectomy  Surgeon:  Jackolyn Confer, M.D.  Anesthesia:  General   Indications:  This is a 52 year old female who developed periumbilical abdominal pain yesterday that radiated to the right lower quadrant. She was evaluated and noted to have findings consistent with acute appendicitis on CT scan. She is now brought to the operating room for laparoscopic appendectomy.   Assistants: None  Anesthesia: General endotracheal anesthesia  Procedure Details   She was brought to the operating room, placed in the supine position and general anesthesia was induced, along with placement of orogastric tube, SCDs, and a Foley catheter. A timeout was performed. The abdomen was prepped and draped in a sterile fashion. A small infraumbilical incision was made through the skin, subcutaneous tissue, fascia, and peritoneum entering the peritoneal cavity under direct vision.  A pursestring suture was passed around the fascia with a 0 Vicryl.  The Hasson was introduced into the peritoneal cavity and the tails of the suture were used to hold the Hasson in place.   The pneumoperitoneum was then established to steady pressure of 15 mmHg.   The laparoscope was introduced and there was no evidence of bleeding or underlying organ injury. Additional 5 mm cannulas then placed in the left lower quadrant of the abdomen and the right upper quadrant region under direct visualization. A careful evaluation of the entire abdomen was carried out. The patient was placed in Trendelenburg and left lateral decubitus position. The small intestines were retracted in the cephalad and left lateral direction away from the pelvis and right lower quadrant. The patient was found to have an enlarged and inflamed appendix that was extending into the pelvis. There was no evidence of  perforation.  The appendices epiploica of the sigmoid colon was adherent to the appendix. This was separated with careful blunt dissection.  The appendix was carefully mobilized. The mesoappendix was was divided with the harmonic scalpel.   The appendix was amputated off the cecum, with a small cuff of cecum, using an endo-GIA stapler.  The appendix was placed in a retrieval bag and removed through the subumbilical port incision.    There was no evidence of bleeding, leakage, or complication after division of the appendix. Copious irrigation was  performed and irrigant fluid suctioned from the abdomen as much as possible.  The umbilical trocar was removed and the  port site fascia was closed via the purse string suture under laparoscopic vision. There was no residual palpable fascial defect.  The remaining trocars were removed and all  trocar site skin wounds were closed with 4-0 Monocryl.  Instrument, sponge, and needle counts were correct at the conclusion of the case.   Findings: The appendix was found to be inflamed. There were not signs of necrosis.  There was not perforation. There was not abscess formation.  Estimated Blood Loss:  200 mL         Drains: None         Specimens: Appendix         Complications:  None; patient tolerated the procedure well.         Disposition: PACU - hemodynamically stable.         Condition: stable

## 2013-12-30 ENCOUNTER — Encounter (HOSPITAL_COMMUNITY): Payer: Self-pay | Admitting: General Surgery

## 2013-12-30 MED ORDER — AMOXICILLIN-POT CLAVULANATE 875-125 MG PO TABS: 1.0000 | ORAL_TABLET | Freq: Two times a day (BID) | ORAL | Status: DC

## 2013-12-30 MED ORDER — OXYCODONE-ACETAMINOPHEN 5-325 MG PO TABS: 1.0000 | ORAL_TABLET | ORAL | Status: DC | PRN

## 2013-12-30 NOTE — Progress Notes (Signed)
DC instructions reviewed with patient. Home med rec carefully reviewed. 2 Rx's given. Pt to f/u with [facility] on 6/30 at 130pm. Patient denies further questions and verbalized understanding of all instructions. No changes noted since am assessment. Patient tolerating percocet and regular diet well. Patient waiting on spouse to arrive to take her home.

## 2013-12-30 NOTE — Discharge Summary (Signed)
Patient ID: Dawn Humphrey MRN: [id number] DOB/AGE: 1961/05/12 53 y.o.  Admit date: 12/29/2013 Discharge date: 12/30/2013  Procedures: lap appy  Consults: None  Reason for Admission: She had the onset of centralized abdominal pain yesterday at 3 PM. It then radiated down to the right lower quadrant. She had some fever and chills as well as nausea. She presented to Dr. Clayborn Heron office and was evaluated. She was sent for a CT scan which was consistent with acute appendicitis. She subsequently was told to come to [facility] emergency department and I was asked to see her.  Admission Diagnoses:  1. Acute appendicitis  Hospital Course: The patient was admitted and taken to the OR where she underwent a lap appy for suppurative appendicitis.  She tolerated the procedure well and on POD 1, she was tolerating a regular diet and her pain was controlled with oral pain meds.  She was stable for dc home.  PE: Abd: soft, appropriately tender, +BS, ND, incisions c/d/i  Discharge Diagnoses:  Active Problems:   Acute appendicitis s/p lap appy  Discharge Medications:   Medication List         amoxicillin-clavulanate 875-125 MG per tablet  Commonly known as:  AUGMENTIN  Take 1 tablet by mouth 2 (two) times daily.     oxyCODONE-acetaminophen 5-325 MG per tablet  Commonly known as:  PERCOCET/ROXICET  Take 1-2 tablets by mouth every 4 (four) hours as needed for moderate pain.        Discharge Instructions:     Follow-up Information   Follow up with [facility] On 01/24/2014. (1:30pm, arrive at 1:00pm for paperwork)    Contact information:   [address]   [place] [address] [phone]      Signed: Henreitta Cea 12/30/2013, 11:55 AM  Agree with above.  Alphonsa Overall, MD, [facility] Surgery Pager: [phone] Office phone:  [phone]

## 2013-12-30 NOTE — Discharge Instructions (Signed)
[facility] ______[facility] °LAPAROSCOPIC SURGERY: POST OP INSTRUCTIONS °Always review your discharge instruction sheet given to you by the facility where your surgery was performed. °IF YOU HAVE DISABILITY OR FAMILY LEAVE FORMS, YOU MUST BRING THEM TO THE OFFICE FOR PROCESSING.   °DO NOT GIVE THEM TO YOUR DOCTOR. ° °1. A prescription for pain medication may be given to you upon discharge.  Take your pain medication as prescribed, if needed.  If narcotic pain medicine is not needed, then you may take acetaminophen (Tylenol) or ibuprofen (Advil) as needed. °2. Take your usually prescribed medications unless otherwise directed. °3. If you need a refill on your pain medication, please contact your pharmacy.  They will contact our office to request authorization. Prescriptions will not be filled after 5pm or on week-ends. °4. You should follow a light diet the first few days after arrival home, such as soup and crackers, etc.  Be sure to include lots of fluids daily. °5. Most patients will experience some swelling and bruising in the area of the incisions.  Ice packs will help.  Swelling and bruising can take several days to resolve.  °6. It is common to experience some constipation if taking pain medication after surgery.  Increasing fluid intake and taking a stool softener (such as Colace) will usually help or prevent this problem from occurring.  A mild laxative (Milk of Magnesia or Miralax) should be taken according to package instructions if there are no bowel movements after 48 hours. °7. Unless discharge instructions indicate otherwise, you may remove your bandages 24-48 hours after surgery, and you may shower at that time.  You may have steri-strips (small skin tapes) in place directly over the incision.  These strips should be left on the skin for 7-10 days.  If your surgeon used skin glue on the incision, you may shower in 24 hours.  The glue will flake off over the next 2-3 weeks.  Any sutures or  staples will be removed at the office during your follow-up visit. °8. ACTIVITIES:  You may resume regular (light) daily activities beginning the next day--such as daily self-care, walking, climbing stairs--gradually increasing activities as tolerated.  You may have sexual intercourse when it is comfortable.  Refrain from any heavy lifting or straining until approved by your doctor. °a. You may drive when you are no longer taking prescription pain medication, you can comfortably wear a seatbelt, and you can safely maneuver your car and apply brakes. °b. RETURN TO WORK:  __________________________________________________________ °9. You should see your doctor in the office for a follow-up appointment approximately 2-3 weeks after your surgery.  Make sure that you call for this appointment within a day or two after you arrive home to insure a convenient appointment time. °10. OTHER INSTRUCTIONS: __________________________________________________________________________________________________________________________ __________________________________________________________________________________________________________________________ °WHEN TO CALL YOUR DOCTOR: °1. Fever over 101.0 °2. Inability to urinate °3. Continued bleeding from incision. °4. Increased pain, redness, or drainage from the incision. °5. Increasing abdominal pain ° °The clinic staff is available to answer your questions during regular business hours.  Please don’t hesitate to call and ask to speak to one of the nurses for clinical concerns.  If you have a medical emergency, go to the nearest emergency room or call 911.  A surgeon from [facility] is always on call at the hospital. °[address], [address], [place], [place]  [address] ? [address] [address], [place],    [address] °[phone] ? [phone] ? [phone] [phone] °Web site:   [email or web address] °

## 2014-01-03 NOTE — Anesthesia Postprocedure Evaluation (Signed)
  Anesthesia Post-op Note  Patient: Dawn Humphrey  Procedure(s) Performed: Procedure(s) (LRB): APPENDECTOMY LAPAROSCOPIC (N/A)  Patient Location: PACU  Anesthesia Type: General  Level of Consciousness: awake and alert   Airway and Oxygen Therapy: Patient Spontanous Breathing  Post-op Pain: mild  Post-op Assessment: Post-op Vital signs reviewed, Patient's Cardiovascular Status Stable, Respiratory Function Stable, Patent Airway and No signs of Nausea or vomiting  Last Vitals:  Filed Vitals:   12/30/13 0600  BP: 131/74  Pulse: 89  Temp: 37.3 C  Resp: 16    Post-op Vital Signs: stable   Complications: No apparent anesthesia complications

## 2014-01-24 ENCOUNTER — Encounter (INDEPENDENT_AMBULATORY_CARE_PROVIDER_SITE_OTHER): Payer: Self-pay

## 2014-01-24 ENCOUNTER — Ambulatory Visit (INDEPENDENT_AMBULATORY_CARE_PROVIDER_SITE_OTHER): Payer: BC Managed Care – PPO | Admitting: General Surgery

## 2014-01-24 VITALS — BP 130/98 | HR 96 | Temp 98.5°F | Ht 64.0 in | Wt 184.0 lb

## 2014-01-24 DIAGNOSIS — K3580 Unspecified acute appendicitis: Secondary | ICD-10-CM

## 2014-01-24 NOTE — Progress Notes (Signed)
KESLEY [facility] [birth year] [id number] 01/24/2014   History of Present Illness: Dawn Humphrey is a  53 y.o. female who presents today status post lap appy by Dr. Jackolyn Confer.  Pathology reveals acute appendicitis.  The patient is tolerating a regular diet, having normal bowel movements, has good pain control.  She  is back to most normal activities.   Physical Exam: Abd: soft, nontender, active bowel sounds, nondistended.  All incisions are well healed.  Impression: 1.  Acute appendicitis, s/p lap appy  Plan: She  is able to return to normal activities. She  may follow up on a prn basis.

## 2014-01-24 NOTE — Patient Instructions (Signed)
Follow up as needed

## 2015-06-06 ENCOUNTER — Encounter: Payer: BLUE CROSS/BLUE SHIELD | Admitting: Family Medicine

## 2017-05-08 ENCOUNTER — Ambulatory Visit: Payer: Self-pay | Admitting: Family Medicine

## 2017-05-11 ENCOUNTER — Encounter: Payer: Self-pay | Admitting: Family Medicine

## 2017-05-11 ENCOUNTER — Encounter: Payer: Self-pay | Admitting: Gastroenterology

## 2017-05-11 ENCOUNTER — Other Ambulatory Visit (HOSPITAL_COMMUNITY)
Admission: RE | Admit: 2017-05-11 | Discharge: 2017-05-11 | Disposition: A | Discharge status: Home / Self Care | Payer: BLUE CROSS/BLUE SHIELD | Source: Ambulatory Visit | Attending: Family Medicine | Admitting: Family Medicine

## 2017-05-11 ENCOUNTER — Ambulatory Visit (INDEPENDENT_AMBULATORY_CARE_PROVIDER_SITE_OTHER): Payer: BLUE CROSS/BLUE SHIELD | Admitting: Family Medicine

## 2017-05-11 VITALS — BP 125/70 | HR 80 | Temp 98.0°F | Ht 64.0 in | Wt 173.2 lb

## 2017-05-11 DIAGNOSIS — Z23 Encounter for immunization: Secondary | ICD-10-CM | POA: Diagnosis not present

## 2017-05-11 DIAGNOSIS — Z114 Encounter for screening for human immunodeficiency virus [HIV]: Secondary | ICD-10-CM | POA: Diagnosis not present

## 2017-05-11 DIAGNOSIS — Z124 Encounter for screening for malignant neoplasm of cervix: Secondary | ICD-10-CM

## 2017-05-11 DIAGNOSIS — Z1159 Encounter for screening for other viral diseases: Secondary | ICD-10-CM | POA: Diagnosis not present

## 2017-05-11 DIAGNOSIS — E663 Overweight: Secondary | ICD-10-CM

## 2017-05-11 DIAGNOSIS — Z1151 Encounter for screening for human papillomavirus (HPV): Secondary | ICD-10-CM | POA: Diagnosis not present

## 2017-05-11 DIAGNOSIS — Z1211 Encounter for screening for malignant neoplasm of colon: Secondary | ICD-10-CM

## 2017-05-11 DIAGNOSIS — J302 Other seasonal allergic rhinitis: Secondary | ICD-10-CM

## 2017-05-11 DIAGNOSIS — Z0000 Encounter for general adult medical examination without abnormal findings: Secondary | ICD-10-CM

## 2017-05-11 DIAGNOSIS — Z1231 Encounter for screening mammogram for malignant neoplasm of breast: Secondary | ICD-10-CM

## 2017-05-11 NOTE — Progress Notes (Signed)
Subjective:  Dawn Humphrey is a 56 y.o. female who presents to the [facility] today to establish care  HPI:  Has not been seen by a PCP in 10+ years and wanted to start taking better care of herself.   Seasonal allergies - very mild, has not needed any OTC medication - only occurs intermittently - sneezing and runny nose, sometimes causes dripping down back of her throat - not having any symptoms of this today   ROS: per HPI , otherwise a 14 point review of systems was performed and was negative  PMH:  The following were reviewed and entered/updated in epic: History reviewed. No pertinent past medical history. There are no active problems to display for this patient.  Past Surgical History:  Procedure Laterality Date  . CESAREAN SECTION  1992  . LAPAROSCOPIC APPENDECTOMY N/A 12/29/2013   Procedure: APPENDECTOMY LAPAROSCOPIC;  Surgeon: Odis Hollingshead, MD;  Location: [facility];  Service: General;  Laterality: N/A;    Family History  Problem Relation Age of Onset  . Memory loss Mother        ? age related  . Lung cancer Father        deceased at age 63  . Diabetes Brother   . Hypertension Brother   . Stroke Brother   . Drug abuse Brother   . Breast cancer Maternal Aunt     Medications- reviewed and updated No current outpatient prescriptions on file.   No current facility-administered medications for this visit.     Allergies-reviewed and updated No Known Allergies  Social History   Social History  . Marital status: Married    Spouse name: N/A  . Number of children: N/A  . Years of education: N/A   Social History Main Topics  . Smoking status: Never Smoker  . Smokeless tobacco: Never Used  . Alcohol use 1.2 oz/week    2 Glasses of wine per week     Comment: 2-3 times per week, 1-2 drinks   . Drug use: No  . Sexual activity: Yes    Partners: Male   Other Topics Concern  . None   Social History Narrative   Lives at home with husband and dog. Exercises  regularly by walking daily.    Objective:  Physical Exam: BP 125/70 (BP Location: Right Arm, Patient Position: Sitting, Cuff Size: Normal)   Temp 98 F (36.7 C) (Oral)   Wt 173 lb 3.2 oz (78.6 kg)   BMI 29.73 kg/m   Gen: NAD, resting comfortably CV: RRR with no murmurs appreciated Pulm: NWOB, CTAB with no crackles, wheezes, or rhonchi GI: Normal bowel sounds present. Soft, Nontender, Nondistended. GU: normal female. Cervix without lesions. No CMT  MSK: no edema, cyanosis, or clubbing noted Skin: warm, dry Neuro: grossly normal, moves all extremities Psych: Normal affect and thought content   Assessment/Plan:  Seasonal allergies Patient well appearing on exam and per history symptoms are very mild and self limited. Advised could try nasal saline spray if needed.  Overweight (BMI 25.0-29.9) Patient eats a balanced health and exercises regularly. Will check baseline bloodwork today including: CBC, BMP, lipid profile.  Healthcare maintenance - received tetanus booster and flu shot today - screening mammogram ordered - referral for routine colonoscopy ordered today  - pap smear obtained today, per history no need for STD testing  Follow up in 1 week if bloodwork normal for annual wellness exam.  Bufford Lope, DO PGY-2, [facility] 05/11/2017 9:12 AM

## 2017-05-11 NOTE — Assessment & Plan Note (Signed)
Patient well appearing on exam and per history symptoms are very mild and self limited. Advised could try nasal saline spray if needed.

## 2017-05-11 NOTE — Assessment & Plan Note (Addendum)
Patient eats a balanced health and exercises regularly. Will check baseline bloodwork today including: CBC, BMP, lipid profile.

## 2017-05-11 NOTE — Patient Instructions (Signed)
It was good to see you today!  For your general health - Thank you for getting your tetanus booster and flu shot today - Please call and make an appointment to get your mammogram done at the breast center - We will send a referral for a colonoscopy, please let us know if you have not heard back about scheduling after 2 weeks.  Things to do to keep yourself healthy  - Exercise at least 30-45 minutes a day, 3-4 days a week.  - Eat a low-fat diet with lots of fruits and vegetables, up to 7-9 servings per day.  - Seatbelts can save your life. Wear them always.  - Smoke detectors on every level of your home, check batteries every year.  - Eye Doctor - have an eye exam every 1-2 years  - Safe sex - if you may be exposed to STDs, use a condom.  - Alcohol -  If you drink, do it moderately, less than 2 drinks per day.  - [place]. Choose someone to speak for you if you are not able.  - Depression is common in our stressful world.If you're feeling down or losing interest in things you normally enjoy, please come in for a visit.  - Violence - If anyone is threatening or hurting you, please call immediately.   Please check-out at the front desk before leaving the clinic. Make an appointment in  1 year for your next annual physical .  We are checking some labs and a pap smear today. If results require attention, either myself or my nurse will get in touch with you. If everything is normal, you will get a letter in the mail or a message in My Chart. Please give [place] a call if you do not hear from [place] after 2 weeks.    Sign up for My Chart to have easy access to your labs results, and communication with your primary care physician.  Feel free to call with any questions or concerns at any time, at [phone].   Take care,  Dr. Bufford Lope, [place]

## 2017-05-11 NOTE — Assessment & Plan Note (Addendum)
-   received tetanus booster and flu shot today - screening mammogram ordered - referral for routine colonoscopy ordered today  - pap smear obtained today, per history no need for STD testing

## 2017-05-11 NOTE — Addendum Note (Signed)
Addended by: Leonia Corona R on: 05/11/2017 12:20 PM   Modules accepted: Orders

## 2017-05-12 ENCOUNTER — Encounter: Payer: Self-pay | Admitting: Family Medicine

## 2017-05-12 LAB — BASIC METABOLIC PANEL
BUN/Creatinine Ratio: 21 (ref 9–23)
BUN: 18 mg/dL (ref 6–24)
CO2: 26 mmol/L (ref 20–29)
CREATININE: 0.84 mg/dL (ref 0.57–1.00)
Calcium: 9.5 mg/dL (ref 8.7–10.2)
Chloride: 101 mmol/L (ref 96–106)
GFR calc Af Amer: 89 mL/min/{1.73_m2} (ref >59)
GFR calc non Af Amer: 77 mL/min/{1.73_m2} (ref >59)
GLUCOSE: 104 mg/dL — AB (ref 65–99)
Potassium: 4.4 mmol/L (ref 3.5–5.2)
Sodium: 143 mmol/L (ref 134–144)

## 2017-05-12 LAB — LIPID PANEL
Chol/HDL Ratio: 2.6 ratio (ref 0.0–4.4)
Cholesterol, Total: 245 mg/dL — ABNORMAL HIGH (ref 100–199)
HDL: 95 mg/dL (ref >39)
LDL CALC: 127 mg/dL — AB (ref 0–99)
Triglycerides: 113 mg/dL (ref 0–149)
VLDL Cholesterol Cal: 23 mg/dL (ref 5–40)

## 2017-05-12 LAB — HIV ANTIBODY (ROUTINE TESTING W REFLEX): HIV SCREEN 4TH GENERATION: NONREACTIVE

## 2017-05-12 LAB — CBC
Hematocrit: 40.6 % (ref 34.0–46.6)
Hemoglobin: 13.6 g/dL (ref 11.1–15.9)
MCH: 31.6 pg (ref 26.6–33.0)
MCHC: 33.5 g/dL (ref 31.5–35.7)
MCV: 94 fL (ref 79–97)
Platelets: 191 10*3/uL (ref 150–379)
RBC: 4.31 x10E6/uL (ref 3.77–5.28)
RDW: 13.1 % (ref 12.3–15.4)
WBC: 4.3 10*3/uL (ref 3.4–10.8)

## 2017-05-12 LAB — HEPATITIS C ANTIBODY: Hep C Virus Ab: <0.1 s/co ratio (ref 0.0–0.9)

## 2017-05-14 LAB — CYTOLOGY - PAP
DIAGNOSIS: NEGATIVE
HPV (WINDOPATH): NOT DETECTED

## 2017-06-01 ENCOUNTER — Ambulatory Visit
Admission: RE | Admit: 2017-06-01 | Discharge: 2017-06-01 | Disposition: A | Discharge status: Home / Self Care | Payer: BLUE CROSS/BLUE SHIELD | Source: Ambulatory Visit | Attending: Family Medicine | Admitting: Family Medicine

## 2017-06-01 DIAGNOSIS — Z1231 Encounter for screening mammogram for malignant neoplasm of breast: Secondary | ICD-10-CM

## 2017-06-10 ENCOUNTER — Other Ambulatory Visit: Payer: Self-pay

## 2017-06-10 ENCOUNTER — Ambulatory Visit (AMBULATORY_SURGERY_CENTER): Payer: Self-pay | Admitting: *Deleted

## 2017-06-10 VITALS — Ht 64.5 in | Wt 173.4 lb

## 2017-06-10 DIAGNOSIS — Z1211 Encounter for screening for malignant neoplasm of colon: Secondary | ICD-10-CM

## 2017-06-10 MED ORDER — SUPREP BOWEL PREP KIT 17.5-3.13-1.6 GM/177ML PO SOLN: 1.0000 | Freq: Once | ORAL | 0 refills | Status: AC

## 2017-06-10 NOTE — Progress Notes (Signed)
Patient denies any allergies to egg or soy products. Patient denies complications with anesthesia/sedation.  Patient denies oxygen use at home and denies diet medications. Pamphlet given on colonoscopy. 

## 2017-06-25 ENCOUNTER — Encounter: Payer: Self-pay | Admitting: Gastroenterology

## 2017-06-26 ENCOUNTER — Telehealth: Payer: Self-pay | Admitting: Gastroenterology

## 2017-06-26 NOTE — Telephone Encounter (Signed)
Returned patient's call.  It was going to be $103 dollars for prep.  I will give her a sample of Plenvu and print new instructions for her to come and pick up.  I told patient that I would hi light the areas with specific instructions for new prep.  She states she may come by today and pick up.  If not, it will be Monday.  All questions were answered.    Sample given Plenvu  Lot#[id number]  Exp. 6/20   New instructions printed.  B.Demetri Goshert, CMA  PV

## 2017-07-01 ENCOUNTER — Other Ambulatory Visit: Payer: Self-pay

## 2017-07-01 ENCOUNTER — Ambulatory Visit (AMBULATORY_SURGERY_CENTER): Payer: BLUE CROSS/BLUE SHIELD | Admitting: Gastroenterology

## 2017-07-01 ENCOUNTER — Encounter: Payer: Self-pay | Admitting: Gastroenterology

## 2017-07-01 VITALS — BP 134/67 | HR 77 | Temp 98.2°F | Resp 14 | Ht 64.5 in | Wt 173.0 lb

## 2017-07-01 DIAGNOSIS — K635 Polyp of colon: Secondary | ICD-10-CM

## 2017-07-01 DIAGNOSIS — D120 Benign neoplasm of cecum: Secondary | ICD-10-CM | POA: Diagnosis not present

## 2017-07-01 DIAGNOSIS — D125 Benign neoplasm of sigmoid colon: Secondary | ICD-10-CM | POA: Diagnosis not present

## 2017-07-01 DIAGNOSIS — Z1211 Encounter for screening for malignant neoplasm of colon: Secondary | ICD-10-CM

## 2017-07-01 MED ORDER — SODIUM CHLORIDE 0.9 % IV SOLN: 500.0000 mL | INTRAVENOUS | Status: DC

## 2017-07-01 NOTE — Patient Instructions (Signed)
YOU HAD AN ENDOSCOPIC PROCEDURE TODAY AT THE  ENDOSCOPY CENTER:   Refer to the procedure report that was given to you for any specific questions about what was found during the examination.  If the procedure report does not answer your questions, please call your gastroenterologist to clarify.  If you requested that your care partner not be given the details of your procedure findings, then the procedure report has been included in a sealed envelope for you to review at your convenience later.  YOU SHOULD EXPECT: Some feelings of bloating in the abdomen. Passage of more gas than usual.  Walking can help get rid of the air that was put into your GI tract during the procedure and reduce the bloating. If you had a lower endoscopy (such as a colonoscopy or flexible sigmoidoscopy) you may notice spotting of blood in your stool or on the toilet paper. If you underwent a bowel prep for your procedure, you may not have a normal bowel movement for a few days.  Please Note:  You might notice some irritation and congestion in your nose or some drainage.  This is from the oxygen used during your procedure.  There is no need for concern and it should clear up in a day or so.  SYMPTOMS TO REPORT IMMEDIATELY:   Following lower endoscopy (colonoscopy or flexible sigmoidoscopy):  Excessive amounts of blood in the stool  Significant tenderness or worsening of abdominal pains  Swelling of the abdomen that is new, acute  Fever of 100F or higher    For urgent or emergent issues, a gastroenterologist can be reached at any hour by calling [phone].   DIET:  We do recommend a small meal at first, but then you may proceed to your regular diet.  Drink plenty of fluids but you should avoid alcoholic beverages for 24 hours.  ACTIVITY:  You should plan to take it easy for the rest of today and you should NOT DRIVE or use heavy machinery until tomorrow (because of the sedation medicines used during the test).     FOLLOW UP: Our staff will call the number listed on your records the next business day following your procedure to check on you and address any questions or concerns that you may have regarding the information given to you following your procedure. If we do not reach you, we will leave a message.  However, if you are feeling well and you are not experiencing any problems, there is no need to return our call.  We will assume that you have returned to your regular daily activities without incident.  If any biopsies were taken you will be contacted by phone or by letter within the next 1-3 weeks.  Please call us at [phone] if you have not heard about the biopsies in 3 weeks.    SIGNATURES/CONFIDENTIALITY: You and/or your care partner have signed paperwork which will be entered into your electronic medical record.  These signatures attest to the fact that that the information above on your After Visit Summary has been reviewed and is understood.  Full responsibility of the confidentiality of this discharge information lies with you and/or your care-partner.    Handouts were given to your care partner on polyps and diverticulosis. You may resume your current medications today. Await biopsy results. Please call if any questions or concerns.   

## 2017-07-01 NOTE — Progress Notes (Signed)
No problems noted in the recovery room. maw 

## 2017-07-01 NOTE — Progress Notes (Signed)
Pt's states no medical or surgical changes since previsit or office visit. 

## 2017-07-01 NOTE — Progress Notes (Signed)
Report given to PACU, vss 

## 2017-07-01 NOTE — Progress Notes (Signed)
Called to room to assist during endoscopic procedure.  Patient ID and intended procedure confirmed with present staff. Received instructions for my participation in the procedure from the performing physician.  

## 2017-07-01 NOTE — Op Note (Signed)
[facility] Patient Name: Dawn Humphrey Procedure Date: 07/01/2017 8:06 AM MRN: [id number] Endoscopist: Mallie Mussel L. Loletha Carrow , MD Age: 56 Referring MD:  Date of Birth: 11-20-1960 Gender: Female Account #: [id number] Procedure:                Colonoscopy Indications:              Screening for colorectal malignant neoplasm, This                            is the patient's first colonoscopy Medicines:                Monitored Anesthesia Care Procedure:                Pre-Anesthesia Assessment:                           - Prior to the procedure, a History and Physical                            was performed, and patient medications and                            allergies were reviewed. The patient's tolerance of                            previous anesthesia was also reviewed. The risks                            and benefits of the procedure and the sedation                            options and risks were discussed with the patient.                            All questions were answered, and informed consent                            was obtained. Prior Anticoagulants: The patient has                            taken no previous anticoagulant or antiplatelet                            agents. ASA Grade Assessment: I - A normal, healthy                            patient. After reviewing the risks and benefits,                            the patient was deemed in satisfactory condition to                            undergo the procedure.  After obtaining informed consent, the colonoscope                            was passed under direct vision. Throughout the                            procedure, the patient's blood pressure, pulse, and                            oxygen saturations were monitored continuously. The                            Model PCF-[id number]) scope was introduced                            through the anus and advanced to the  the cecum,                            identified by appendiceal orifice and ileocecal                            valve. The colonoscopy was performed without                            difficulty. The patient tolerated the procedure                            well. The quality of the bowel preparation was                            excellent. The ileocecal valve, appendiceal                            orifice, and rectum were photographed. The quality                            of the bowel preparation was evaluated using the                            BBPS [facility] Bowel Preparation Scale) with scores                            of: Right Colon = 3, Transverse Colon = 3 and Left                            Colon = 3 (entire mucosa seen well with no residual                            staining, small fragments of stool or opaque                            liquid). The total BBPS score equals 9. The bowel  preparation used was SUPREP. Scope In: 8:09:15 AM Scope Out: 8:24:39 AM Scope Withdrawal Time: 0 hours 11 minutes 17 seconds  Total Procedure Duration: 0 hours 15 minutes 24 seconds  Findings:                 The perianal and digital rectal examinations were                            normal.                           Two sessile polyps were found in the distal sigmoid                            colon and cecum. The polyps were 2 to 4 mm in size.                            These polyps were removed with a cold snare.                            Resection and retrieval were complete.                           Multiple small-mouthed diverticula were found in                            the left colon.                           The exam was otherwise without abnormality on                            direct and retroflexion views. Complications:            No immediate complications. Estimated Blood Loss:     Estimated blood loss was minimal. Impression:                - Two 2 to 4 mm polyps in the distal sigmoid colon                            and in the cecum, removed with a cold snare.                            Resected and retrieved.                           - Diverticulosis in the left colon.                           - The examination was otherwise normal on direct                            and retroflexion views. Recommendation:           - Patient has a contact number available for  emergencies. The signs and symptoms of potential                            delayed complications were discussed with the                            patient. Return to normal activities tomorrow.                            Written discharge instructions were provided to the                            patient.                           - Resume previous diet.                           - Continue present medications.                           - Await pathology results.                           - Repeat colonoscopy is recommended for                            surveillance. The colonoscopy date will be                            determined after pathology results from today's                            exam become available for review. Johnthomas Lader L. Loletha Carrow, MD 07/01/2017 8:27:15 AM This report has been signed electronically.

## 2017-07-02 ENCOUNTER — Telehealth: Payer: Self-pay

## 2017-07-02 NOTE — Telephone Encounter (Signed)
  Follow up Call-  Call back number 07/01/2017  Post procedure Call Back phone  # [phone]  Permission to leave phone message Yes  Some recent data might be hidden     Patient questions:  Do you have a fever, pain , or abdominal swelling? No. Pain Score  0 *  Have you tolerated food without any problems? Yes.    Have you been able to return to your normal activities? Yes.    Do you have any questions about your discharge instructions: Diet   No. Medications  No. Follow up visit  No.  Do you have questions or concerns about your Care? No.  Actions: * If pain score is 4 or above: No action needed, pain <4.

## 2017-07-07 ENCOUNTER — Encounter: Payer: Self-pay | Admitting: Gastroenterology

## 2018-02-08 ENCOUNTER — Other Ambulatory Visit: Payer: Self-pay

## 2018-02-08 ENCOUNTER — Ambulatory Visit (INDEPENDENT_AMBULATORY_CARE_PROVIDER_SITE_OTHER): Payer: BLUE CROSS/BLUE SHIELD | Admitting: Family Medicine

## 2018-02-08 ENCOUNTER — Ambulatory Visit (HOSPITAL_COMMUNITY)
Admission: RE | Admit: 2018-02-08 | Discharge: 2018-02-08 | Disposition: A | Discharge status: Home / Self Care | Payer: BLUE CROSS/BLUE SHIELD | Source: Ambulatory Visit | Attending: Family Medicine | Admitting: Family Medicine

## 2018-02-08 ENCOUNTER — Encounter: Payer: Self-pay | Admitting: Family Medicine

## 2018-02-08 VITALS — BP 170/90 | HR 93 | Temp 98.7°F | Ht 64.5 in | Wt 177.0 lb

## 2018-02-08 DIAGNOSIS — L989 Disorder of the skin and subcutaneous tissue, unspecified: Secondary | ICD-10-CM | POA: Diagnosis not present

## 2018-02-08 DIAGNOSIS — I10 Essential (primary) hypertension: Secondary | ICD-10-CM | POA: Diagnosis not present

## 2018-02-08 MED ORDER — LISINOPRIL 10 MG PO TABS: 10.0000 mg | ORAL_TABLET | Freq: Every day | ORAL | 0 refills | Status: DC

## 2018-02-08 NOTE — Progress Notes (Signed)
Subjective:  Dawn Humphrey is a 57 y.o. female who presents to the [facility] today with a chief complaint of not feeling well.   HPI:  Patient states that she has not felt well over the last 2 weeks.  She has felt tired and has had headaches and has had some left-sided chest tightness.  She says that her chest pain feels like an intermittent mild discomfort.  She says that it feels like she has to burp but it does not get better with passing gas or burping.  She has not noticed any worsening with exertion.  No associated shortness of breath or diaphoresis.  No radiation.  Not associated with positional changes or made worse by palpation of the area. She went to [facility] on 02/05/2018 and her blood pressure was elevated 177/104 at the time with a pulse of 97. Today she states that she also has that chest pressure like feeling.  She has been under an increased amount of stress lately.  She has not had any lightheadedness or dizziness.  She has not had any vision changes.  Skin concern She has a rough spot on her right upper back that is been present for years.  Not growing.  No bleeding.  Has never scratched at it or had any known trauma to the area.  ROS: Per HPI  Objective:  Physical Exam: BP (!) 170/90   Pulse 93   Temp 98.7 F (37.1 C) (Oral)   Ht 5' 4.5" (1.638 m)   Wt 177 lb (80.3 kg)   SpO2 99%   BMI 29.91 kg/m   Gen: NAD, resting comfortably HEENT: , AT. PERRL CV: RRR with no murmurs appreciated Pulm: NWOB, CTAB with no crackles, wheezes, or rhonchi GI: Normal bowel sounds present. Soft, Nontender, Nondistended. MSK: no edema, cyanosis, or clubbing noted Skin: 3 mm indented irregular skin lesion on right upper back that is hypopigmented and rough to touch.  No scaling or erythema Neuro: grossly normal, moves all extremities Psych: Normal affect and thought content  EKG: NSR  Assessment/Plan:  Hypertension Patient with 2 elevated blood pressure readings, the first that  is patient reported from blood pressure check at pharmacy and again in office today.  Given her history of mild nonspecific chest pain, EKG was performed today that showed normal sinus rhythm which is reassuring that patient likely does not have ACS.  Her chest pain may be related to her elevated blood pressure.  Start lisinopril 10 mg daily.  Follow-up in 1 month with BMP at that time  Skin lesion of back Patient has a long-standing skin lesion on her right upper back that is not changed over the course of years.  Punch biopsy performed today, see procedure note below. Specimen sent for pathology.  Patient given care after instructions.  PROCEDURE NOTE: Skin biopsy Patient given informed consent, signed copy in the chart. Appropriate time out taken. Area prepped and cleaned usual sterile fashion. A 2 cc of lidocaine 1% with epinephrine was used for local anesthesia. Once anesthesia was obtained,  A 10mm keyes punch was used to obtain a sample of the lesion. This was sent for pathology. Pressure used for hemostasis. Amount of bleeding was extremely minimal, less than 2 cc. There were no complications. Patient was given post procedure instructions including signs to watch for such as erythema, pain, plus, unusual swelling. Pathology is pending and the patient will be contacted regarding results.   Bufford Lope, DO PGY-3, [facility]  02/08/2018 2:52 PM

## 2018-02-08 NOTE — Assessment & Plan Note (Addendum)
Patient has a long-standing skin lesion on her right upper back that is not changed over the course of years.  Punch biopsy performed today, see procedure note below. Specimen sent for pathology.  Patient given care after instructions.

## 2018-02-08 NOTE — Patient Instructions (Signed)
Start taking lisinopril 10mg  daily. Come back in 1 month for BP recheck and blood work.   We will send your punch biopsy for pathology, someone will call you about the results so please let us know if you have not heard back after 2 weeks.  Keep the area clean and covered, do not get wet for the first 24 hours. Then you can use warm water and soap to keep clean.    Skin Biopsy, Care After Refer to this sheet in the next few weeks. These instructions provide you with information about caring for yourself after your procedure. Your health care provider may also give you more specific instructions. Your treatment has been planned according to current medical practices, but problems sometimes occur. Call your health care provider if you have any problems or questions after your procedure. What can I expect after the procedure? After the procedure, it is common to have:  Soreness.  Bruising.  Itching.  Follow these instructions at home:  Rest and then return to your normal activities as told by your health care provider.  Take over-the-counter and prescription medicines only as told by your health care provider.  Follow instructions from your health care provider about how to take care of your biopsy site.Make sure you: ? Wash your hands with soap and water before you change your bandage (dressing). If soap and water are not available, use hand sanitizer. ? Change your dressing as told by your health care provider. ? Leave stitches (sutures), skin glue, or adhesive strips in place. These skin closures may need to stay in place for 2 weeks or longer. If adhesive strip edges start to loosen and curl up, you may trim the loose edges. Do not remove adhesive strips completely unless your health care provider tells you to do that. If the biopsy area bleeds, apply gentle pressure for 10 minutes.  Check your biopsy site every day for signs of infection. Check for: ? More redness, swelling, or  pain. ? More fluid or blood. ? Warmth. ? Pus or a bad smell.  Keep all follow-up visits as told by your health care provider. This is important. Contact a health care provider if:  You have more redness, swelling, or pain around your biopsy site.  You have more fluid or blood coming from your biopsy site.  Your biopsy site feels warm to the touch.  You have pus or a bad smell coming from your biopsy site.  You have a fever. Get help right away if:  You have bleeding that does not stop with pressure or a dressing. This information is not intended to replace advice given to you by your health care provider. Make sure you discuss any questions you have with your health care provider. Document Released: 08/10/2015 Document Revised: 03/09/2016 Document Reviewed: 10/11/2014 Elsevier Interactive Patient Education  [employer].

## 2018-02-08 NOTE — Assessment & Plan Note (Signed)
Patient with 2 elevated blood pressure readings, the first that is patient reported from blood pressure check at pharmacy and again in office today.  Given her history of mild nonspecific chest pain, EKG was performed today that showed normal sinus rhythm which is reassuring that patient likely does not have ACS.  Her chest pain may be related to her elevated blood pressure.  Start lisinopril 10 mg daily.  Follow-up in 1 month with BMP at that time

## 2018-03-11 ENCOUNTER — Ambulatory Visit: Payer: BLUE CROSS/BLUE SHIELD | Admitting: Family Medicine

## 2018-03-11 ENCOUNTER — Encounter

## 2018-03-16 ENCOUNTER — Encounter: Payer: Self-pay | Admitting: Family Medicine

## 2018-03-16 ENCOUNTER — Ambulatory Visit (INDEPENDENT_AMBULATORY_CARE_PROVIDER_SITE_OTHER): Payer: BLUE CROSS/BLUE SHIELD | Admitting: Family Medicine

## 2018-03-16 ENCOUNTER — Other Ambulatory Visit: Payer: Self-pay

## 2018-03-16 VITALS — BP 118/70 | HR 98 | Temp 98.7°F | Wt 179.0 lb

## 2018-03-16 DIAGNOSIS — I10 Essential (primary) hypertension: Secondary | ICD-10-CM

## 2018-03-16 NOTE — Progress Notes (Signed)
    Subjective:  Dawn Humphrey is a 57 y.o. female who presents to the [facility] today for hypertension follow-up.   HPI:  Subjective:  Dawn Humphrey is a 57 y.o. female with hypertension. Current Outpatient Medications  Medication Sig Dispense Refill  . lisinopril (PRINIVIL,ZESTRIL) 10 MG tablet Take 1 tablet (10 mg total) by mouth daily. 30 tablet 0   No current facility-administered medications for this visit.     Had briefly missed some doses because she had been having a hard time mentally wrapping her mind around needing to take a daily medication.  When she was off of the lisinopril briefly her blood pressure was very elevated.  Since restarting she has been doing well.  She was a little worried that her blood pressure be elevated because she has been under some increased stress this week.  Her mother had died in her sleep on 2022-11-11 and she has been coping well but having to do a lot with her complicated family dynamics  taking medications as instructed, no medication side effects noted, home BP monitoring in range of 497'W systolic over 26'V diastolic, no TIA's, no chest pain on exertion, no dyspnea on exertion, no swelling of ankles, no orthostatic dizziness or lightheadedness and no palpitations.  New concerns: none.   ROS: Per HPI   Objective:  Physical Exam: BP 118/70   Pulse 98   Temp 98.7 F (37.1 C) (Oral)   Wt 179 lb (81.2 kg)   SpO2 97%   BMI 30.25 kg/m   Gen: NAD, resting comfortably CV: RRR with no murmurs appreciated Pulm: NWOB, CTAB with no crackles, wheezes, or rhonchi GI: Normal bowel sounds present. Soft, Nontender, Nondistended. MSK: no edema, cyanosis, or clubbing noted Skin: warm, dry Neuro: grossly normal, moves all extremities Psych: Normal affect and thought content    Assessment/Plan:  Hypertension Controlled and doing well on lisinopril 10 mg daily.  Continue current management.  Check BMP today.  Follow-up in 6 months   Bufford Lope,  DO PGY-3, [facility] 03/16/2018 3:09 PM

## 2018-03-16 NOTE — Assessment & Plan Note (Signed)
Controlled and doing well on lisinopril 10 mg daily.  Continue current management.  Check BMP today.  Follow-up in 6 months

## 2018-03-16 NOTE — Patient Instructions (Addendum)
Continue lisinopril 10mg  daily. We will check your electrolytes today. If results require attention, either myself or my nurse will get in touch with you. If everything is normal, you will get a letter in the mail or a message in My Chart. Please give [place] a call if you do not hear from [place] after 2 weeks.   Follow up in 6 months for blood pressure follow up.   Feel free to call with any questions or concerns at any time, at [phone].   Take care,  Dr. Bufford Lope, [place]

## 2018-03-17 ENCOUNTER — Encounter: Payer: Self-pay | Admitting: Family Medicine

## 2018-03-17 LAB — BASIC METABOLIC PANEL
BUN/Creatinine Ratio: 27 — ABNORMAL HIGH (ref 9–23)
BUN: 20 mg/dL (ref 6–24)
CALCIUM: 9.6 mg/dL (ref 8.7–10.2)
CO2: 23 mmol/L (ref 20–29)
Chloride: 102 mmol/L (ref 96–106)
Creatinine, Ser: 0.73 mg/dL (ref 0.57–1.00)
GFR calc Af Amer: 104 mL/min/{1.73_m2} (ref >59)
GFR calc non Af Amer: 90 mL/min/{1.73_m2} (ref >59)
GLUCOSE: 150 mg/dL — AB (ref 65–99)
Potassium: 4.2 mmol/L (ref 3.5–5.2)
Sodium: 140 mmol/L (ref 134–144)

## 2018-03-23 ENCOUNTER — Other Ambulatory Visit: Payer: Self-pay | Admitting: Family Medicine

## 2018-03-23 DIAGNOSIS — I10 Essential (primary) hypertension: Secondary | ICD-10-CM

## 2018-11-20 IMAGING — MG DIGITAL SCREENING BILATERAL MAMMOGRAM WITH CAD
4 series · 4 of 4 positions shown · non-contrast
Comparison: Previous exam(s).

CLINICAL DATA: Screening.

EXAM:
DIGITAL SCREENING BILATERAL MAMMOGRAM WITH CAD

[L CC]
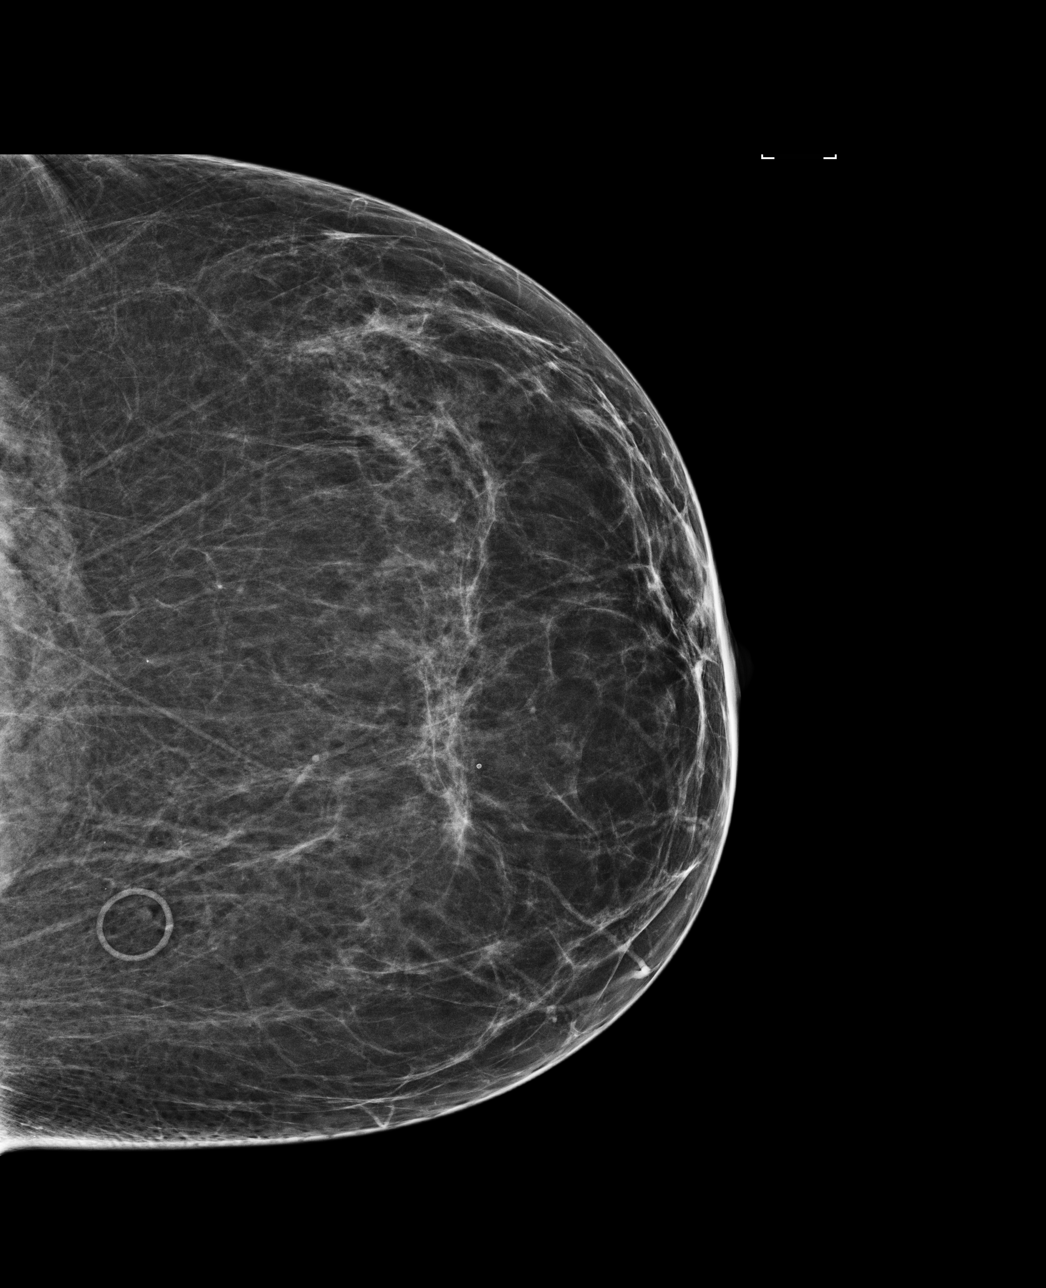

[R MLO]
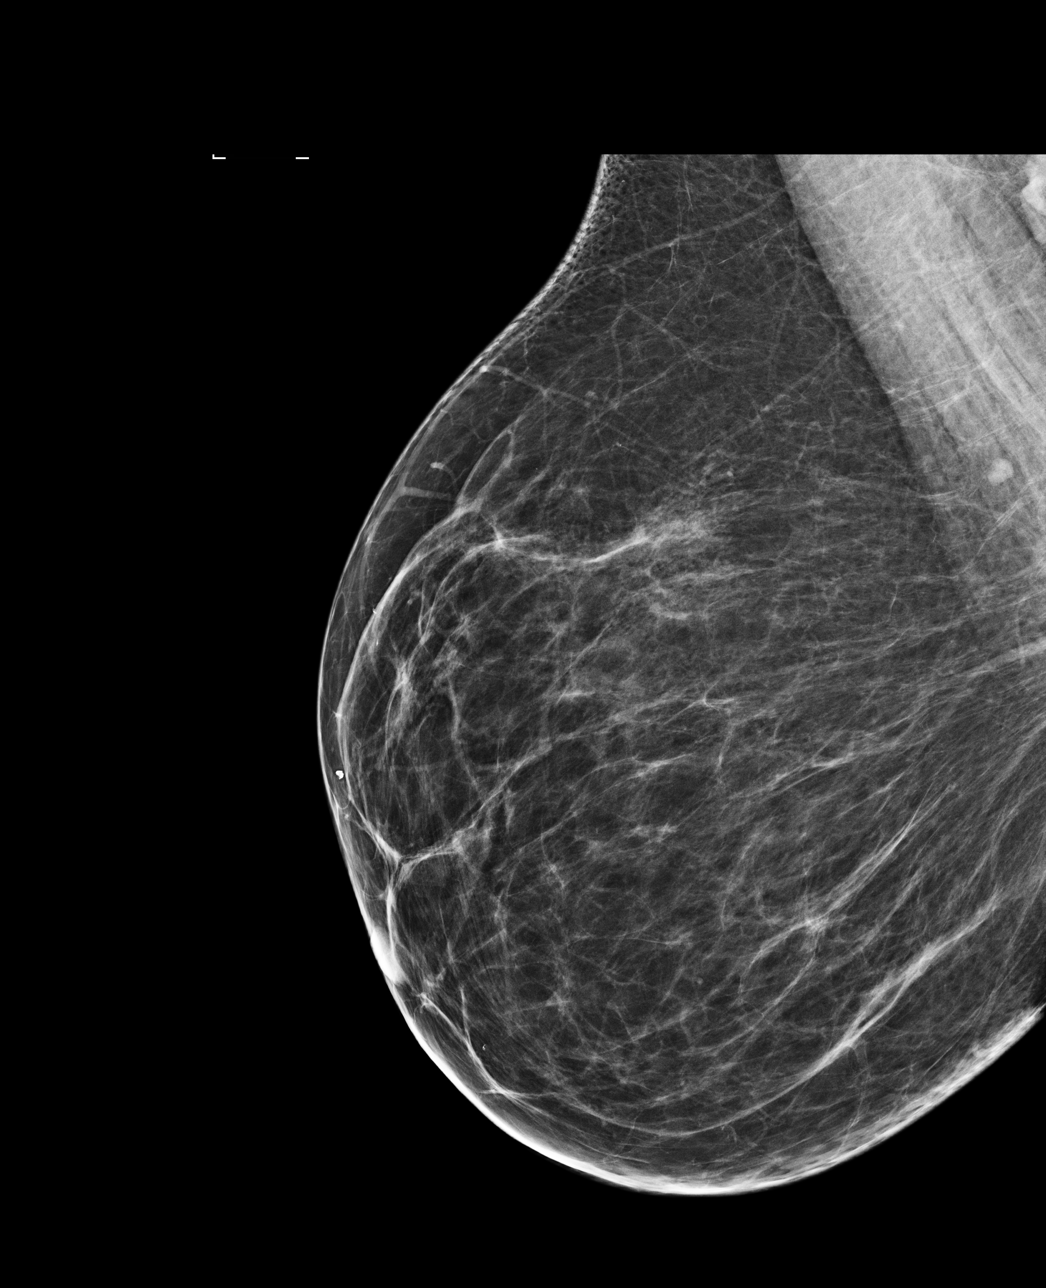

[L MLO]
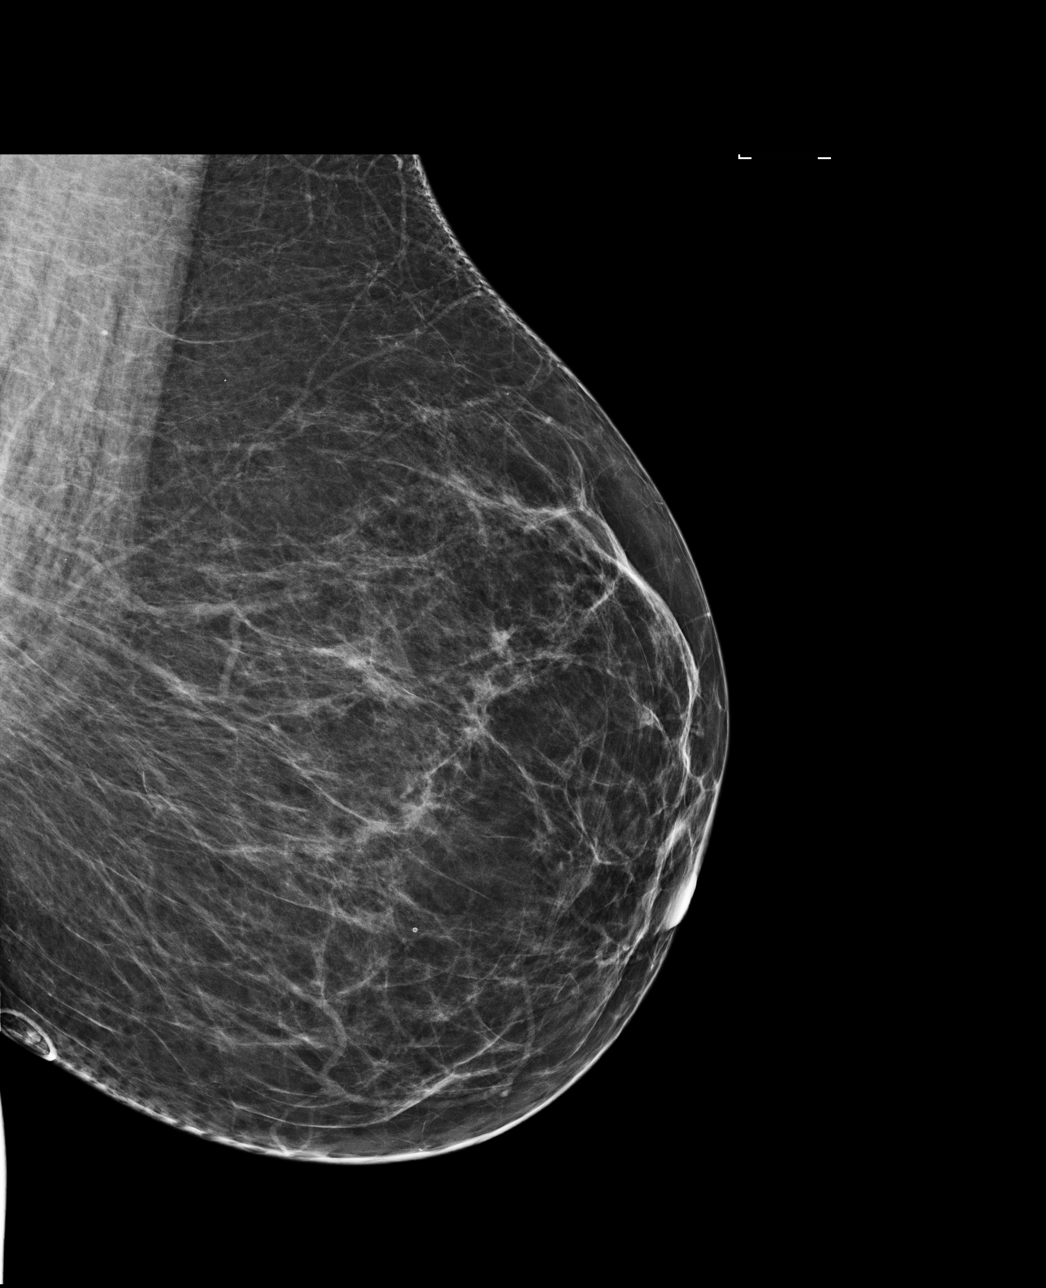

[R CC]
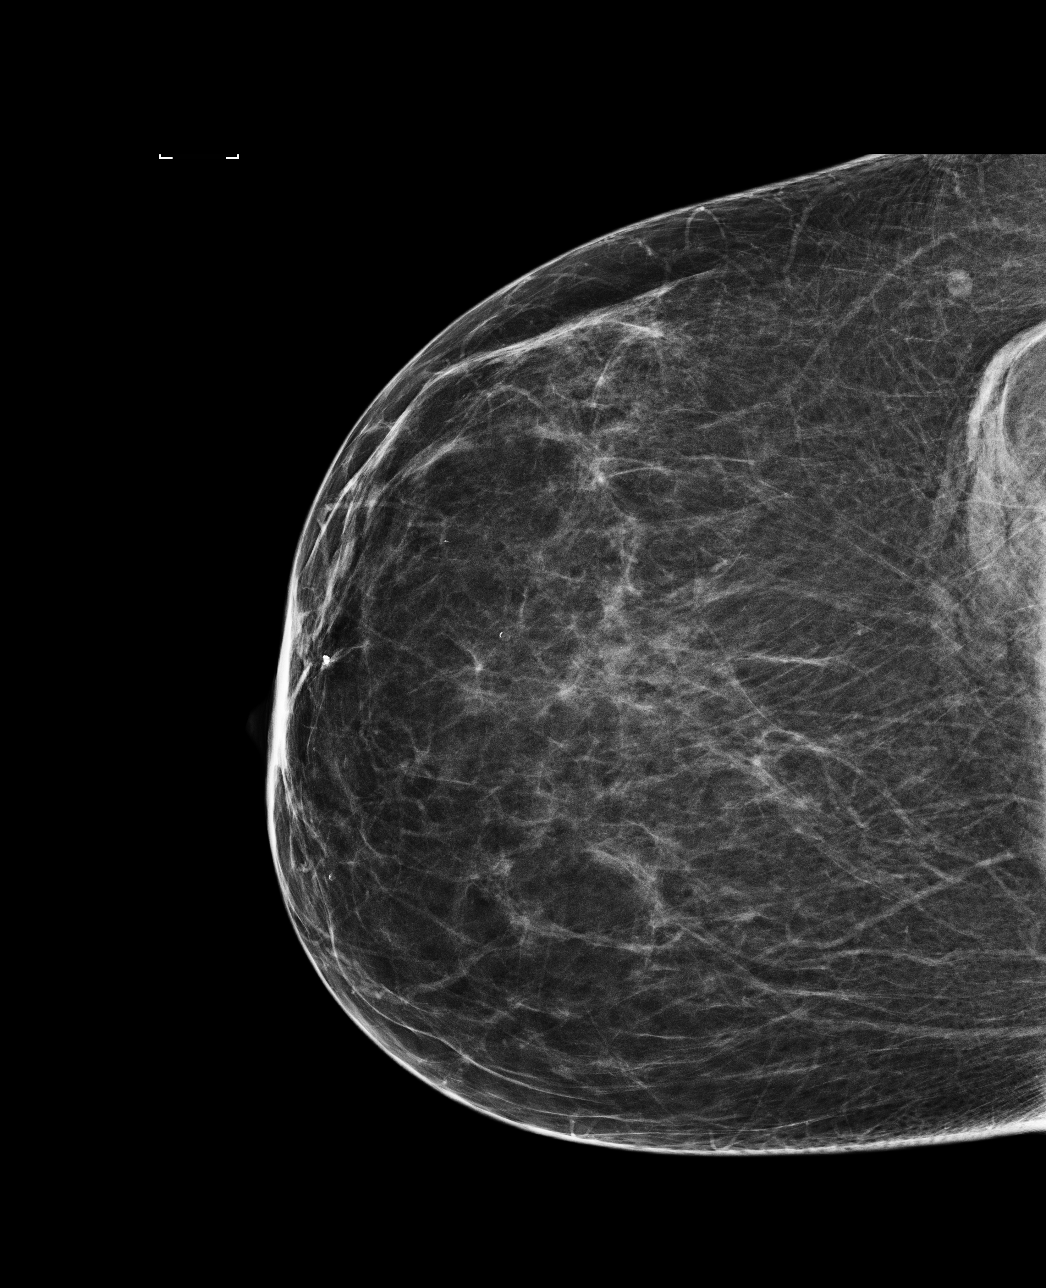

[4 of 4 positions shown; findings below may reference images not displayed]

ACR Breast Density Category b: There are scattered areas of
fibroglandular density.
FINDINGS: There are no findings suspicious for malignancy. Images were
processed with CAD.
IMPRESSION: No mammographic evidence of malignancy. A result letter of this
screening mammogram will be mailed directly to the patient.

RECOMMENDATION:
Screening mammogram in one year. (Code:AS-G-LCT)

BI-RADS CATEGORY  1: Negative.

## 2019-09-24 ENCOUNTER — Ambulatory Visit: Payer: BLUE CROSS/BLUE SHIELD | Attending: Internal Medicine

## 2019-09-24 DIAGNOSIS — Z23 Encounter for immunization: Secondary | ICD-10-CM | POA: Insufficient documentation

## 2019-09-24 NOTE — Progress Notes (Signed)
   Covid-19 Vaccination Clinic  Name:  Laferne Barzee    MRN: [id number] DOB: August 26, 1960  09/24/2019  Ms. Waltzer was observed post Covid-19 immunization for 15 minutes without incidence. She was provided with Vaccine Information Sheet and instruction to access the V-Safe system.   Ms. Sarasin was instructed to call 911 with any severe reactions post vaccine: Marland Kitchen Difficulty breathing  . Swelling of your face and throat  . A fast heartbeat  . A bad rash all over your body  . Dizziness and weakness    Immunizations Administered    Name Date Dose VIS Date Route   Pfizer COVID-19 Vaccine 09/24/2019  1:42 PM 0.3 mL 07/08/2019 Intramuscular   Manufacturer: [place]   Lot: [id number]   [place]: [id number]

## 2019-10-15 ENCOUNTER — Ambulatory Visit: Payer: BLUE CROSS/BLUE SHIELD | Attending: Internal Medicine

## 2019-10-15 DIAGNOSIS — Z23 Encounter for immunization: Secondary | ICD-10-CM

## 2019-10-15 NOTE — Progress Notes (Signed)
   Covid-19 Vaccination Clinic  Name:  Dawn Humphrey    MRN: [id number] DOB: 1961-06-07  10/15/2019  [name] was observed post Covid-19 immunization for 15 minutes without incident. She was provided with Vaccine Information Sheet and instruction to access the V-Safe system.   [name] was instructed to call 911 with any severe reactions post vaccine: Marland Kitchen Difficulty breathing  . Swelling of face and throat  . A fast heartbeat  . A bad rash all over body  . Dizziness and weakness   Immunizations Administered    Name Date Dose VIS Date Route   Pfizer COVID-19 Vaccine 10/15/2019 10:01 AM 0.3 mL 07/08/2019 Intramuscular   Manufacturer: [place]   Lot: [id number]   [place]: [id number]

## 2019-10-19 ENCOUNTER — Ambulatory Visit: Payer: BLUE CROSS/BLUE SHIELD

## 2020-08-13 ENCOUNTER — Other Ambulatory Visit: Payer: BLUE CROSS/BLUE SHIELD

## 2021-05-30 ENCOUNTER — Other Ambulatory Visit (HOSPITAL_BASED_OUTPATIENT_CLINIC_OR_DEPARTMENT_OTHER): Payer: Self-pay

## 2021-05-30 ENCOUNTER — Ambulatory Visit: Payer: BLUE CROSS/BLUE SHIELD | Attending: Internal Medicine

## 2021-05-30 DIAGNOSIS — Z23 Encounter for immunization: Secondary | ICD-10-CM

## 2021-05-30 MED ORDER — PFIZER COVID-19 VAC BIVALENT 30 MCG/0.3ML IM SUSP
INTRAMUSCULAR | 0 refills | Status: AC
  Filled 2021-05-30: qty 0.3, 1d supply, fill #0

## 2021-05-30 NOTE — Progress Notes (Signed)
   Covid-19 Vaccination Clinic  Name:  Arah Aro    MRN: [id number] DOB: 03-23-1961  05/30/2021  Ms. Bernath was observed post Covid-19 immunization for 15 minutes without incident. She was provided with Vaccine Information Sheet and instruction to access the V-Safe system.   Ms. Taliaferro was instructed to call 911 with any severe reactions post vaccine: Difficulty breathing  Swelling of face and throat  A fast heartbeat  A bad rash all over body  Dizziness and weakness   Immunizations Administered     Name Date Dose VIS Date Route   Pfizer Covid-19 Vaccine Bivalent Booster 05/30/2021  1:52 PM 0.3 mL 03/27/2021 Intramuscular   Manufacturer: [place]   Lot: [id number]   [place]: [phone]

## 2024-08-01 ENCOUNTER — Encounter: Payer: Self-pay | Admitting: Gastroenterology
